# Patient Record
Sex: Male | Born: 1935 | Race: White | Hispanic: No | Smoking: Light tobacco smoker
Health system: Southern US, Community
[De-identification: ages and names within clinical notes are randomized; demographics above are authoritative.]

## PROBLEM LIST (undated history)

## (undated) DIAGNOSIS — E7800 Pure hypercholesterolemia, unspecified: Secondary | ICD-10-CM

## (undated) DIAGNOSIS — R011 Cardiac murmur, unspecified: Secondary | ICD-10-CM

## (undated) DIAGNOSIS — F419 Anxiety disorder, unspecified: Secondary | ICD-10-CM

## (undated) DIAGNOSIS — R51 Headache: Secondary | ICD-10-CM

## (undated) DIAGNOSIS — R519 Headache, unspecified: Secondary | ICD-10-CM

## (undated) DIAGNOSIS — C801 Malignant (primary) neoplasm, unspecified: Secondary | ICD-10-CM

## (undated) HISTORY — DX: Malignant (primary) neoplasm, unspecified: C80.1

## (undated) HISTORY — DX: Pure hypercholesterolemia, unspecified: E78.00

---

## 2011-02-04 ENCOUNTER — Ambulatory Visit (INDEPENDENT_AMBULATORY_CARE_PROVIDER_SITE_OTHER): Payer: Medicare Other | Admitting: Orthopedic Surgery

## 2011-02-04 ENCOUNTER — Encounter: Payer: Self-pay | Admitting: Orthopedic Surgery

## 2011-02-04 VITALS — HR 78 | Resp 18 | Wt 161.0 lb

## 2011-02-04 DIAGNOSIS — M75101 Unspecified rotator cuff tear or rupture of right shoulder, not specified as traumatic: Secondary | ICD-10-CM | POA: Insufficient documentation

## 2011-02-04 DIAGNOSIS — M719 Bursopathy, unspecified: Secondary | ICD-10-CM

## 2011-02-04 MED ORDER — METHYLPREDNISOLONE ACETATE 40 MG/ML IJ SUSP: 40.0000 mg | Freq: Once | INTRAMUSCULAR | Status: DC

## 2011-02-04 NOTE — Progress Notes (Signed)
   New patient  76 years old atraumatic onset of throbbing constant RIGHT shoulder pain associated with loss of internal rotation.  Patient has pain at night patient has had pain for 6 months  No trauma  He would like a shot in the shoulder  He reports all review of systems 14 of them negative  History reviewed. No pertinent past medical history.  History reviewed. No pertinent past surgical history.  Physical Exam(12) GENERAL: normal development   CDV: pulses are normal   Skin: normal  Lymph: nodes were not palpable/normal  Psychiatric: awake, alert and oriented  Neuro: normal sensation  MSK RIGHT shoulder exam 1No tenderness over the a.c. Joint although it is prominent no swelling 2 Range of motion with flexion and external rotation but he has decreased internal rotation 3 Rotator cuff strength normal 4 Shoulder stability normal 5 Impingement sign positive 6 Cross chest compression test and negative  Assessment: Rotator cuff syndrome    Plan: Inject RIGHT shoulder subacromial space  Shoulder Injection Procedure Note   Pre-operative Diagnosis: right  RC Syndrome  Post-operative Diagnosis: same  Indications: pain   Anesthesia: ethyl chloride   Procedure Details   Verbal consent was obtained for the procedure. The shoulder was prepped withalcohol and the skin was anesthetized. A 20 gauge needle was advanced into the subacromial space through posterior approach without difficulty  The space was then injected with 3 ml 1% lidocaine and 1 ml of depomedrol. The injection site was cleansed with isopropyl alcohol and a dressing was applied.  Complications:  None; patient tolerated the procedure well.

## 2011-02-04 NOTE — Patient Instructions (Signed)
You have received a steroid shot. 15% of patients experience increased pain at the injection site with in the next 24 hours. This is best treated with ice and tylenol extra strength 2 tabs every 8 hours. If you are still having pain please call the office.    

## 2014-10-16 ENCOUNTER — Encounter (INDEPENDENT_AMBULATORY_CARE_PROVIDER_SITE_OTHER): Payer: Self-pay | Admitting: *Deleted

## 2014-11-12 ENCOUNTER — Telehealth (INDEPENDENT_AMBULATORY_CARE_PROVIDER_SITE_OTHER): Payer: Self-pay | Admitting: *Deleted

## 2014-11-12 ENCOUNTER — Encounter (INDEPENDENT_AMBULATORY_CARE_PROVIDER_SITE_OTHER): Payer: Self-pay | Admitting: Internal Medicine

## 2014-11-12 ENCOUNTER — Other Ambulatory Visit (INDEPENDENT_AMBULATORY_CARE_PROVIDER_SITE_OTHER): Payer: Self-pay | Admitting: *Deleted

## 2014-11-12 ENCOUNTER — Ambulatory Visit (INDEPENDENT_AMBULATORY_CARE_PROVIDER_SITE_OTHER): Payer: Medicare Other | Admitting: Internal Medicine

## 2014-11-12 VITALS — BP 122/62 | HR 64 | Temp 98.0°F | Ht 67.0 in | Wt 142.0 lb

## 2014-11-12 DIAGNOSIS — K625 Hemorrhage of anus and rectum: Secondary | ICD-10-CM

## 2014-11-12 DIAGNOSIS — R198 Other specified symptoms and signs involving the digestive system and abdomen: Secondary | ICD-10-CM | POA: Diagnosis not present

## 2014-11-12 DIAGNOSIS — K6289 Other specified diseases of anus and rectum: Secondary | ICD-10-CM

## 2014-11-12 DIAGNOSIS — K6389 Other specified diseases of intestine: Secondary | ICD-10-CM

## 2014-11-12 MED ORDER — PEG 3350-KCL-NA BICARB-NACL 420 G PO SOLR: 4000.0000 mL | Freq: Once | ORAL | Status: DC

## 2014-11-12 NOTE — Patient Instructions (Signed)
Colonoscopy.  The risks and benefits such as perforation, bleeding, and infection were reviewed with the patient and is agreeable. 

## 2014-11-12 NOTE — Progress Notes (Addendum)
   Subjective:    Patient ID: Brad Sanchez, male    DOB: 1934/11/28, 80 y.o.   MRN: [id number]  HPI Referred to our office by Dr. Everette Rank for rectal bleeding. Patient states he has had rectal bleeding. He denies any rectal or abdominal pain . He has had the bleeding since February or March. He sees the blood daily if he doesn't have a good BM. He see the blood in the stool and when he wipes. There has been no change in his stools. Sometimes he has diarrhea. Sometimes he has fecal incontinence. Appetite is not good. He says he has lost about 15 pounds this year. He denies abdominal pain. No family hx of colon cancer. He has never undergone a colonoscopy in the past.    10/08/2014 H and H 12.2 and 38.0, MCV 83.3, platelet ct 213, total protein 5.6, albumin 3.3   Review of Systems Widowed. Three boys in good health.     Past Medical History  Diagnosis Date  . High cholesterol     No past surgical history on file.  No Known Allergies  Current Outpatient Prescriptions on File Prior to Visit  Medication Sig Dispense Refill  . LORazepam (ATIVAN) 1 MG tablet Take 1 mg by mouth every 8 (eight) hours.      . rosuvastatin (CRESTOR) 20 MG tablet Take 20 mg by mouth daily.       Current Facility-Administered Medications on File Prior to Visit  Medication Dose Route Frequency Provider Last Rate Last Dose  . methylPREDNISolone acetate (DEPO-MEDROL) injection 40 mg  40 mg Intra-articular Once Carole Civil, MD         Objective:   Physical Exam Blood pressure 122/62, pulse 64, temperature 98 F (36.7 C), height 5\' 7"  (1.702 m), weight 142 lb (64.411 kg). Alert and oriented. Skin warm and dry. Oral mucosa is moist.   . Sclera anicteric, conjunctivae is pink. Thyroid not enlarged. No cervical lymphadenopathy. Lungs clear. Heart regular rate and rhythm. Loud murmur heard.  Abdomen is soft. Bowel sounds are positive. No hepatomegaly. No abdominal masses felt. No tenderness. Large  rectal mass felt. Guaiac positive. Dr Laural Golden in with patient. Rectal exam by Dr. Laural Golden also revealed rectal mass.     Developer: [id number] ( 04/2018) Card: Lot [id number] (12/18)    Assessment & Plan:  Rectal bleeding. Rectal mass. Colonic carcinoma needs to be ruled out. Colonoscopy. I discussed this case with Dr. Laural Golden.

## 2014-11-12 NOTE — Telephone Encounter (Signed)
Patient needs trilyte 

## 2014-11-13 ENCOUNTER — Other Ambulatory Visit (INDEPENDENT_AMBULATORY_CARE_PROVIDER_SITE_OTHER): Payer: Self-pay | Admitting: *Deleted

## 2014-11-13 ENCOUNTER — Encounter (INDEPENDENT_AMBULATORY_CARE_PROVIDER_SITE_OTHER): Payer: Self-pay

## 2014-11-13 ENCOUNTER — Encounter (HOSPITAL_COMMUNITY): Payer: Self-pay | Admitting: *Deleted

## 2014-11-13 ENCOUNTER — Encounter (HOSPITAL_COMMUNITY)
Admission: RE | Disposition: A | Discharge status: Home / Self Care | Payer: Self-pay | Source: Ambulatory Visit | Attending: Internal Medicine

## 2014-11-13 ENCOUNTER — Telehealth (INDEPENDENT_AMBULATORY_CARE_PROVIDER_SITE_OTHER): Payer: Self-pay | Admitting: *Deleted

## 2014-11-13 ENCOUNTER — Ambulatory Visit (HOSPITAL_COMMUNITY)
Admission: RE | Admit: 2014-11-13 | Discharge: 2014-11-13 | Disposition: A | Discharge status: Home / Self Care | Payer: Medicare Other | Source: Ambulatory Visit | Attending: Internal Medicine | Admitting: Internal Medicine

## 2014-11-13 ENCOUNTER — Ambulatory Visit (HOSPITAL_COMMUNITY): Payer: Medicare Other

## 2014-11-13 DIAGNOSIS — K5730 Diverticulosis of large intestine without perforation or abscess without bleeding: Secondary | ICD-10-CM | POA: Insufficient documentation

## 2014-11-13 DIAGNOSIS — E780 Pure hypercholesterolemia: Secondary | ICD-10-CM | POA: Diagnosis not present

## 2014-11-13 DIAGNOSIS — D127 Benign neoplasm of rectosigmoid junction: Secondary | ICD-10-CM | POA: Insufficient documentation

## 2014-11-13 DIAGNOSIS — D123 Benign neoplasm of transverse colon: Secondary | ICD-10-CM | POA: Diagnosis not present

## 2014-11-13 DIAGNOSIS — Z9119 Patient's noncompliance with other medical treatment and regimen: Secondary | ICD-10-CM | POA: Diagnosis not present

## 2014-11-13 DIAGNOSIS — C20 Malignant neoplasm of rectum: Secondary | ICD-10-CM

## 2014-11-13 DIAGNOSIS — Z79899 Other long term (current) drug therapy: Secondary | ICD-10-CM | POA: Insufficient documentation

## 2014-11-13 DIAGNOSIS — K6289 Other specified diseases of anus and rectum: Secondary | ICD-10-CM

## 2014-11-13 DIAGNOSIS — K625 Hemorrhage of anus and rectum: Secondary | ICD-10-CM

## 2014-11-13 HISTORY — PX: COLONOSCOPY: SHX5424

## 2014-11-13 LAB — CBC
HCT: 33.5 % — ABNORMAL LOW (ref 39.0–52.0)
HEMOGLOBIN: 10.7 g/dL — AB (ref 13.0–17.0)
MCH: 26.6 pg (ref 26.0–34.0)
MCHC: 31.9 g/dL (ref 30.0–36.0)
MCV: 83.3 fL (ref 78.0–100.0)
Platelets: 190 10*3/uL (ref 150–400)
RBC: 4.02 MIL/uL — ABNORMAL LOW (ref 4.22–5.81)
RDW: 12.9 % (ref 11.5–15.5)
WBC: 6.8 10*3/uL (ref 4.0–10.5)

## 2014-11-13 LAB — CREATININE, SERUM
Creatinine, Ser: 0.88 mg/dL (ref 0.61–1.24)
GFR calc Af Amer: >60 mL/min (ref >60)

## 2014-11-13 SURGERY — COLONOSCOPY
Anesthesia: Moderate Sedation

## 2014-11-13 MED ORDER — MEPERIDINE HCL 50 MG/ML IJ SOLN
INTRAMUSCULAR | Status: AC
  Filled 2014-11-13: qty 1

## 2014-11-13 MED ORDER — IOHEXOL 300 MG/ML  SOLN
100.0000 mL | Freq: Once | INTRAMUSCULAR | Status: AC | PRN
Start: 2014-11-13 — End: 2014-11-13
  Administered 2014-11-13: 100 mL via INTRAVENOUS

## 2014-11-13 MED ORDER — SODIUM CHLORIDE 0.9 % IV SOLN
INTRAVENOUS | Status: DC
  Administered 2014-11-13: 1000 mL via INTRAVENOUS

## 2014-11-13 MED ORDER — MEPERIDINE HCL 50 MG/ML IJ SOLN
INTRAMUSCULAR | Status: DC | PRN
  Administered 2014-11-13 (×2): 25 mg via INTRAVENOUS

## 2014-11-13 MED ORDER — STERILE WATER FOR IRRIGATION IR SOLN
Status: DC | PRN
  Administered 2014-11-13: 09:00:00

## 2014-11-13 MED ORDER — MIDAZOLAM HCL 5 MG/5ML IJ SOLN
INTRAMUSCULAR | Status: AC
  Filled 2014-11-13: qty 10

## 2014-11-13 MED ORDER — MIDAZOLAM HCL 5 MG/5ML IJ SOLN
INTRAMUSCULAR | Status: DC | PRN
  Administered 2014-11-13 (×2): 2 mg via INTRAVENOUS
  Administered 2014-11-13: 1 mg via INTRAVENOUS
  Administered 2014-11-13 (×2): 2 mg via INTRAVENOUS
  Administered 2014-11-13: 1 mg via INTRAVENOUS

## 2014-11-13 NOTE — Progress Notes (Signed)
Radiology notified of orders for CT today.  Contrast to be brought to patient to drink.

## 2014-11-13 NOTE — Progress Notes (Signed)
Patient to Radiology via wheelchair.  Patient Ride (friend Lina Sayre notified to pick up patient around 12:45pm).

## 2014-11-13 NOTE — H&P (Signed)
Brad Sanchez is an 80 y.o. male.   Chief Complaint: Patient is here for colonoscopy. HPI: Patient is 80 year old Caucasian male was referred through the courtesy of Dr. Luster Landsberg Maginnis for colonoscopy. He has never been screened for CRC. He presented again with history of rectal bleeding with onset in February this year. He was found to have rectal mass. His H&H and 10/08/2014 was 12.2 and 38.0. He has lost 15 pounds over several months. He believes his weight loss is because he does not cover and needs foods that are microwavable. He has had intermittent diarrhea. Family history is negative for CRC.  Past Medical History  Diagnosis Date  . High cholesterol     History reviewed. No pertinent past surgical history.  History reviewed. No pertinent family history. Social History:  reports that he has never smoked. He does not have any smokeless tobacco history on file. He reports that he does not drink alcohol or use illicit drugs.  Allergies: No Known Allergies  Facility-administered medications prior to admission  Medication Dose Route Frequency Provider Last Rate Last Dose  . methylPREDNISolone acetate (DEPO-MEDROL) injection 40 mg  40 mg Intra-articular Once Carole Civil, MD       Medications Prior to Admission  Medication Sig Dispense Refill  . acetaminophen (TYLENOL) 325 MG tablet Take 650 mg by mouth every 6 (six) hours as needed.    Marland Kitchen LORazepam (ATIVAN) 1 MG tablet Take 1 mg by mouth every 8 (eight) hours.      . polyethylene glycol-electrolytes (NULYTELY/GOLYTELY) 420 G solution Take 4,000 mLs by mouth once. 4000 mL 0  . rosuvastatin (CRESTOR) 20 MG tablet Take 20 mg by mouth daily.        No results found for this or any previous visit (from the past 48 hour(s)). No results found.  ROS  Blood pressure 144/62, pulse 89, temperature 97.9 F (36.6 C), temperature source Oral, resp. rate 19, SpO2 100 %. Physical Exam  Constitutional: He appears well-developed  and well-nourished.  HENT:  Mouth/Throat: Oropharynx is clear and moist.  Eyes: Conjunctivae are normal.  Neck: No thyromegaly present.  Cardiovascular: Normal rate and regular rhythm.   Murmur (Grade 2/6 high patient murmur best heard at L LSB) heard. GI: Soft. He exhibits no distension and no mass. There is no tenderness.  Musculoskeletal: He exhibits no edema.  Lymphadenopathy:    He has no cervical adenopathy.  Neurological: He is alert.  Skin: Skin is warm and dry.     Assessment/Plan Rectal bleeding. Rectal mass. Diagnostic colonoscopy.  REHMAN,NAJEEB U 11/13/2014, 8:38 AM

## 2014-11-13 NOTE — Progress Notes (Signed)
Patient tolerating second bottle of Omni300.

## 2014-11-13 NOTE — Progress Notes (Signed)
Dr. Laural Golden in to speak with patient.

## 2014-11-13 NOTE — Discharge Instructions (Signed)
Resume usual medications and diet. No driving for 24 hours. Physician will call with results of blood tests biopsy and CT.      Colonoscopy, Care After These instructions give you information on caring for yourself after your procedure. Your doctor may also give you more specific instructions. Call your doctor if you have any problems or questions after your procedure. HOME CARE  Do not drive for 24 hours.  Do not sign important papers or use machinery for 24 hours.  You may shower.  You may go back to your usual activities, but go slower for the first 24 hours.  Take rest breaks often during the first 24 hours.  Walk around or use warm packs on your belly (abdomen) if you have belly cramping or gas.  Drink enough fluids to keep your pee (urine) clear or pale yellow.  Resume your normal diet. Avoid heavy or fried foods.  Avoid drinking alcohol for 24 hours or as told by your doctor.  Only take medicines as told by your doctor. If a tissue sample (biopsy) was taken during the procedure:   Do not take aspirin or blood thinners for 7 days, or as told by your doctor.  Do not drink alcohol for 7 days, or as told by your doctor.  Eat soft foods for the first 24 hours. GET HELP IF: You still have a small amount of blood in your poop (stool) 2-3 days after the procedure. GET HELP RIGHT AWAY IF:  You have more than a small amount of blood in your poop.  You see clumps of tissue (blood clots) in your poop.  Your belly is puffy (swollen).  You feel sick to your stomach (nauseous) or throw up (vomit).  You have a fever.  You have belly pain that gets worse and medicine does not help. MAKE SURE YOU:  Understand these instructions.  Will watch your condition.  Will get help right away if you are not doing well or get worse.   Diverticulosis Diverticulosis is the condition that develops when small pouches (diverticula) form in the wall of your colon. Your colon, or  large intestine, is where water is absorbed and stool is formed. The pouches form when the inside layer of your colon pushes through weak spots in the outer layers of your colon. CAUSES  No one knows exactly what causes diverticulosis. RISK FACTORS  Being older than 32. Your risk for this condition increases with age. Diverticulosis is rare in people younger than 40 years. By age 29, almost everyone has it.  Eating a low-fiber diet.  Being frequently constipated.  Being overweight.  Not getting enough exercise.  Smoking.  Taking over-the-counter pain medicines, like aspirin and ibuprofen. SYMPTOMS  Most people with diverticulosis do not have symptoms. DIAGNOSIS  Because diverticulosis often has no symptoms, health care providers often discover the condition during an exam for other colon problems. In many cases, a health care provider will diagnose diverticulosis while using a flexible scope to examine the colon (colonoscopy). TREATMENT  If you have never developed an infection related to diverticulosis, you may not need treatment. If you have had an infection before, treatment may include:  Eating more fruits, vegetables, and grains.  Taking a fiber supplement.  Taking a live bacteria supplement (probiotic).  Taking medicine to relax your colon. HOME CARE INSTRUCTIONS   Drink at least 6-8 glasses of water each day to prevent constipation.  Try not to strain when you have a bowel movement.  Keep  all follow-up appointments. If you have had an infection before:  Increase the fiber in your diet as directed by your health care provider or dietitian.  Take a dietary fiber supplement if your health care provider approves.  Only take medicines as directed by your health care provider. SEEK MEDICAL CARE IF:   You have abdominal pain.  You have bloating.  You have cramps.  You have not gone to the bathroom in 3 days. SEEK IMMEDIATE MEDICAL CARE IF:   Your pain gets  worse.  Yourbloating becomes very bad.  You have a fever or chills, and your symptoms suddenly get worse.  You begin vomiting.  You have bowel movements that are bloody or black. MAKE SURE YOU:  Understand these instructions.  Will watch your condition.  Will get help right away if you are not doing well or get worse.   r.

## 2014-11-13 NOTE — Op Note (Signed)
COLONOSCOPY PROCEDURE REPORT  PATIENT:  Brad Sanchez  MR#:  [id number] Birthdate:  1934/09/02, 80 y.o., male Endoscopist:  Dr. Rogene Houston, MD Referred By:  Dr. [name], MD  Procedure Date: 11/13/2014  Procedure:   Colonoscopy  Indications:  Patient is 80 year old Caucasian male who presents with rectal bleeding and on exam noted to have rectal mass. He has never been screened for CRC. He has lost 15 pounds over the last few months. Family history is negative for CRC.  Informed Consent:  The procedure and risks were reviewed with the patient and informed consent was obtained.  Medications:  Demerol 50 mg IV Versed 10 mg IV  Description of procedure:  After a digital rectal exam was performed, that colonoscope was advanced from the anus through the rectum and colon to the area of the cecum, ileocecal valve and appendiceal orifice. The cecum was deeply intubated. These structures were well-seen and photographed for the record. From the level of the cecum and ileocecal valve, the scope was slowly and cautiously withdrawn. The mucosal surfaces were carefully surveyed utilizing scope tip to flexion to facilitate fold flattening as needed. The scope was pulled down into the rectum where a thorough exam including retroflexion was performed.  Findings:  Prep suboptimal with a lot of thick liquid stool as well as pieces of formed stool requiring vigorous washing throughout the procedure. Cecal landmarks were well-seen. 12 mm and 8 mm polyp in the region of hepatic flexure. These polyps were not snared because of prep. Scattered diverticula at descending and sigmoid colon. Large distal rectal mass with luminal narrowing. Multiple biopsies taken. This mass was also easily palpable on digital exam.   Therapeutic/Diagnostic Maneuvers Performed:  See above  Complications:  None  Cecal Withdrawal Time:  9  minutes  Impression:  Examination performed to cecum. Two polyps noted  in the region of hepatic flexure and one  at rectosigmoid. These polyps were not snared because of poor prep. GERD diverticula descending and sigmoid colon. Large distal rectal mass with noncritical luminal narrowing. And anoscopic appearance consistent with adenocarcinoma arising out of adenoma. This mass is also palpable on digital exam/multiple biopsies taken   Recommendations:  Standard instructions given. Check CBC and CEA today along with serum creatinine. Chest and abdominopelvic CT and contrast. I will contact patient with biopsy results and further recommendations.  Brad Sanchez  11/13/2014 9:41 AM  CC: Dr. [name], MD & Dr. Rayne Du ref. provider found

## 2014-11-13 NOTE — Progress Notes (Signed)
Patient up to bathroom to void, approximately 15cc red blood passed from rectum.  Patient currently waiting for CT scan. Dr. Laural Golden notified.  Per Dr. Laural Golden bleeding is from biopsies.  Reassured patient per Dr. Laural Golden.  Lab results from 11-13-2014 reviewed with Dr. Laural Golden.  Dr. Laural Golden to call patient this pm after testing completed.

## 2014-11-13 NOTE — Telephone Encounter (Signed)
PA's for CT chest/abd/pelvis  CT chest [phone] and CT A/P [id number]

## 2014-11-13 NOTE — Progress Notes (Signed)
Patient tolerated and completed first bottle of contrast Omni300.

## 2014-11-13 NOTE — Progress Notes (Signed)
Patient returned from Radiology. Ready for Discharge.

## 2014-11-14 LAB — CEA: CEA: 0.9 ng/mL (ref 0.0–4.7)

## 2014-11-15 ENCOUNTER — Telehealth (INDEPENDENT_AMBULATORY_CARE_PROVIDER_SITE_OTHER): Payer: Self-pay | Admitting: Internal Medicine

## 2014-11-15 ENCOUNTER — Encounter (HOSPITAL_COMMUNITY): Payer: Self-pay | Admitting: Internal Medicine

## 2014-11-15 ENCOUNTER — Telehealth: Payer: Self-pay

## 2014-11-15 DIAGNOSIS — C20 Malignant neoplasm of rectum: Secondary | ICD-10-CM

## 2014-11-15 NOTE — Telephone Encounter (Signed)
-----   Message from Milus Banister, MD sent at 11/15/2014 11:33 AM EDT ----- Regarding: RE: rectal EUS OK, I reviewed the colonoscopy. CT, labs.  HE needs lower EUS, radial only, ++MAC, next available EUS Thursday. Dx. Staging for rectal adenocarcinoma.  Thanks  ----- Message -----    From: Barron Alvine, CMA    Sent: 11/15/2014   8:33 AM      To: Milus Banister, MD Subject: Melton Alar: rectal EUS                                   ----- Message -----    From: Worthy Keeler    Sent: 11/15/2014   7:57 AM      To: Barron Alvine, CMA Subject: rectal EUS                                     Hi Amiya Escamilla, per Dr Laural Golden Mr Lepore needs a rectal EUS for rectal CA, all records in [facility] thanks

## 2014-11-15 NOTE — Telephone Encounter (Signed)
Patient presented today to go over biopsy report from his colonoscopy. Patient adenocarcinoma of the rectum. All questions were answered to the best of my knowledge. He will see Dr. Ardis Hughs for EUS of the rectum.  Referral has been made. He will be seeing Dr. Whitney Muse in [place] at the [facility] [place]. Referral made for Dr. Whitney Muse. Would like to thank Lacretia Nicks for helping me with this.

## 2014-11-20 ENCOUNTER — Other Ambulatory Visit: Payer: Self-pay

## 2014-11-20 ENCOUNTER — Encounter (HOSPITAL_COMMUNITY): Payer: Self-pay | Admitting: *Deleted

## 2014-11-20 DIAGNOSIS — C20 Malignant neoplasm of rectum: Secondary | ICD-10-CM

## 2014-11-20 NOTE — Telephone Encounter (Signed)
Brad Sanchez scheduled for 11/28/14 1230 pm instructions have been mailed and message left for pt to return call for verbal instructions

## 2014-11-21 ENCOUNTER — Encounter (HOSPITAL_COMMUNITY): Payer: Medicare Other | Attending: Hematology & Oncology | Admitting: Hematology & Oncology

## 2014-11-21 ENCOUNTER — Encounter (HOSPITAL_COMMUNITY): Payer: Self-pay | Admitting: Hematology & Oncology

## 2014-11-21 VITALS — BP 142/68 | HR 80 | Temp 98.2°F | Resp 18 | Wt 142.6 lb

## 2014-11-21 DIAGNOSIS — R634 Abnormal weight loss: Secondary | ICD-10-CM | POA: Diagnosis not present

## 2014-11-21 DIAGNOSIS — C20 Malignant neoplasm of rectum: Secondary | ICD-10-CM

## 2014-11-21 LAB — CBC WITH DIFFERENTIAL/PLATELET
BASOS ABS: 0 10*3/uL (ref 0.0–0.1)
Basophils Relative: 0 % (ref 0–1)
Eosinophils Absolute: 0.2 10*3/uL (ref 0.0–0.7)
Eosinophils Relative: 2 % (ref 0–5)
HEMATOCRIT: 37.5 % — AB (ref 39.0–52.0)
Hemoglobin: 11.9 g/dL — ABNORMAL LOW (ref 13.0–17.0)
LYMPHS ABS: 2.3 10*3/uL (ref 0.7–4.0)
LYMPHS PCT: 25 % (ref 12–46)
MCH: 26.5 pg (ref 26.0–34.0)
MCHC: 31.7 g/dL (ref 30.0–36.0)
MCV: 83.5 fL (ref 78.0–100.0)
Monocytes Absolute: 1.2 10*3/uL — ABNORMAL HIGH (ref 0.1–1.0)
Monocytes Relative: 13 % — ABNORMAL HIGH (ref 3–12)
Neutro Abs: 5.5 10*3/uL (ref 1.7–7.7)
Neutrophils Relative %: 60 % (ref 43–77)
PLATELETS: 231 10*3/uL (ref 150–400)
RBC: 4.49 MIL/uL (ref 4.22–5.81)
RDW: 13.5 % (ref 11.5–15.5)
WBC: 9.1 10*3/uL (ref 4.0–10.5)

## 2014-11-21 LAB — COMPREHENSIVE METABOLIC PANEL
ALBUMIN: 3.2 g/dL — AB (ref 3.5–5.0)
ALT: 12 U/L — ABNORMAL LOW (ref 17–63)
AST: 18 U/L (ref 15–41)
Alkaline Phosphatase: 67 U/L (ref 38–126)
Anion gap: 7 (ref 5–15)
BILIRUBIN TOTAL: 0.5 mg/dL (ref 0.3–1.2)
BUN: 15 mg/dL (ref 6–20)
CHLORIDE: 100 mmol/L — AB (ref 101–111)
CO2: 28 mmol/L (ref 22–32)
Calcium: 8.5 mg/dL — ABNORMAL LOW (ref 8.9–10.3)
Creatinine, Ser: 0.84 mg/dL (ref 0.61–1.24)
GFR calc Af Amer: >60 mL/min (ref >60)
GFR calc non Af Amer: >60 mL/min (ref >60)
Glucose, Bld: 105 mg/dL — ABNORMAL HIGH (ref 65–99)
Potassium: 4.4 mmol/L (ref 3.5–5.1)
Sodium: 135 mmol/L (ref 135–145)
Total Protein: 6.3 g/dL — ABNORMAL LOW (ref 6.5–8.1)

## 2014-11-21 LAB — IRON AND TIBC
IRON: 19 ug/dL — AB (ref 45–182)
Saturation Ratios: 6 % — ABNORMAL LOW (ref 17.9–39.5)
TIBC: 326 ug/dL (ref 250–450)
UIBC: 307 ug/dL

## 2014-11-21 LAB — FERRITIN: Ferritin: 11 ng/mL — ABNORMAL LOW (ref 24–336)

## 2014-11-21 NOTE — Progress Notes (Signed)
[facility] NOTE  Patient Care Team: Marjean Donna, MD as PCP - General (Family Medicine)  CHIEF COMPLAINTS/PURPOSE OF CONSULTATION:  11/13/2014 Colonoscopy on 11/13/2014 with large distal rectal mass with noncritical luminal narrowing (Dr. Laural Golden) Biopsy c/w adenocarcinoma CT C/A/P on 11/13/2014 with bulky annular soft tissue mass involving the rectum measuring approximately 5.6 x 5.5 x 6.5 cm, c/w newly diagnosed rectal carcinoma. No evidence of metastatic disease  HISTORY OF PRESENTING ILLNESS:  Brad Sanchez 80 y.o. male is here because of a diagnosed adenocarcinoma of the rectum. His here today with his son Sherren Mocha and his daughter-in-law [name]. Currently lives with his son.  He has an upcoming endoscopy with Dr. Ardis Hughs in [place] on the 23rd.  Patient notes he is to do yard work and mow grass but becomes more fatigue than previously. He says he still does his yard work. He uses his riding lawnmower more. He meets with his friends every morning for breakfast and spends about 2 hours with them. He and his family note that his appetite is definitely decreased. Both think he has lost approximately 15 pounds over the last several months.  He learned of his cancer first from an overdue physical with Dr. Denton Lank.  A rectal exam was performed and he states that according to the doctor "something didn't feel right".  He was then referred to Dr. Laural Golden for a colonoscopy.  He was then referred to [facility].   He states that he noticed blood in stool before having the physical for about 4 months He says that he currently experiences no pain  He is going on vacation 7/10-7/14  He is adamant that he does not want any procedures or treatment to interfere with his planned vacation.   He has been taking OTC ferrous sulfate once daily.   MEDICAL HISTORY:  Past Medical History  Diagnosis Date  . High cholesterol   . Anxiety   . Heart murmur     Dr. Emilee Hero  told him 15 years ago  . Headache   . Cancer     rectal    SURGICAL HISTORY: Past Surgical History  Procedure Laterality Date  . Colonoscopy N/A 11/13/2014    Procedure: COLONOSCOPY;  Surgeon: Rogene Houston, MD;  Location: [facility];  Service: Endoscopy;  Laterality: N/A;  730    SOCIAL HISTORY: History   Social History  . Marital Status: Widowed    Spouse Name: N/A  . Number of Children: N/A  . Years of Education: N/A   Occupational History  . retired    Social History Main Topics  . Smoking status: Light Tobacco Smoker    Types: Cigars  . Smokeless tobacco: Not on file  . Alcohol Use: No  . Drug Use: No  . Sexual Activity: Not on file   Other Topics Concern  . Not on file   Social History Narrative  He is widowed, his wife was taken care of at [place] for 15 yrs.  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 No pertinent family history. indicated that his mother is deceased. He reported the following about his father: unknown.  Mother deceased, age 43, breast cancer   Only child  ALLERGIES:  has No Known Allergies.  MEDICATIONS:  No current outpatient prescriptions on file.  No current facility-administered medications for this visit.    Review of Systems  Constitutional: Positive for weight loss.       15 lbs over 20 years No dentures  Cardiovascular: Positive for leg swelling.       Ankle edema  Gastrointestinal: Positive for blood in stool.       Positive for 4 months  Skin: Positive for itching.       Ankle edema  Neurological: Positive for weakness.  All other systems reviewed and are negative.  14 point ROS was done and is otherwise as detailed above or in HPI   PHYSICAL EXAMINATION: ECOG PERFORMANCE STATUS: 1 - Symptomatic but completely ambulatory  Filed Vitals:    11/21/14 1357  BP: 142/68  Pulse: 80  Temp: 98.2 F (36.8 C)  Resp: 18   Filed Weights   11/21/14 1357  Weight: 142 lb 9.6 oz (64.683 kg)    Physical Exam  Constitutional: He is oriented to person, place, and time and well-developed, well-nourished, and in no distress.  Thin, somewhat confused at times  HENT:  Head: Normocephalic and atraumatic.  Nose: Nose normal.  Mouth/Throat: Oropharynx is clear and moist. No oropharyngeal exudate.  Eyes: Conjunctivae and EOM are normal. Pupils are equal, round, and reactive to light. Right eye exhibits no discharge. Left eye exhibits no discharge. No scleral icterus.  Neck: Normal range of motion. Neck supple. No tracheal deviation present. No thyromegaly present.  Cardiovascular: Normal rate and regular rhythm.  Exam reveals no gallop and no friction rub.   Murmur heard. Systolic ejection murmur  Pulmonary/Chest: Effort normal and breath sounds normal. He has no wheezes. He has no rales.  Abdominal: Soft. Bowel sounds are normal. He exhibits no distension and no mass. There is no tenderness. There is no rebound and no guarding.  Musculoskeletal: Normal range of motion. He exhibits no edema.  Lymphadenopathy:    He has no cervical adenopathy.  Neurological: He is alert and oriented to person, place, and time. He has normal reflexes. No cranial nerve deficit. Gait normal. Coordination normal.  Skin: Skin is warm and dry. No rash noted.  Psychiatric: Mood, memory, affect and judgment normal.  Nursing note and vitals reviewed.    LABORATORY DATA:  I have reviewed the data as listed Lab Results  Component Value Date   WBC 9.1 11/21/2014   HGB 11.9* 11/21/2014   HCT 37.5* 11/21/2014   MCV 83.5 11/21/2014   PLT 231 11/21/2014   Results for DOMINGOS, RIGGI (MRN [id number]) as of 11/28/2014 12:00  Ref. Range 11/13/2014 09:55  CEA Latest Ref Range: 0.0-4.7 ng/mL 0.9   RADIOGRAPHIC STUDIES: I have personally reviewed the radiological  images as listed and agreed with the findings in the report.  CLINICAL DATA: Newly diagnosed rectal carcinoma by colonoscopy. Rectal bleeding. 15 lb weight loss over past several months.  EXAM: CT CHEST, ABDOMEN, AND PELVIS WITH CONTRAST  TECHNIQUE: Multidetector CT imaging of the chest, abdomen and pelvis was performed following the standard protocol during bolus administration of intravenous contrast.  CONTRAST: 163mL OMNIPAQUE IOHEXOL 300 MG/ML SOLN  COMPARISON: None.  FINDINGS: CT CHEST FINDINGS  IMPRESSION: Bulky annular soft tissue mass involving the rectum measuring approximately 5.6 x 5.5 x 6.5 cm, consistent with newly diagnosed rectal carcinoma.  No evidence of pelvic lymphadenopathy or distant metastatic disease.  Sigmoid diverticulosis. No radiographic evidence of diverticulitis.   Electronically Signed  By: Earle Gell M.D.  On: 11/13/2014 13:07    ASSESSMENT & PLAN:  Rectal Adenocarcinoma CT C/A/P on 11/13/2014 without distant metastatic disease Lower EUS scheduled with Dr. Ardis Hughs 6/23 Mild Dementia Weight loss  I discussed with the patient and his family in detail that he has a primary rectal cancer. We briefly reviewed the NCCN guidelines. I advised them that his staging is currently not complete and that his study with Dr. Ardis Hughs next week as part of the staging process. I reviewed this CT imaging of his chest abdomen and pelvis and advised them we do not have evidence that the cancer is outside of the rectal area.  We briefly reviewed the role of chemotherapy, radiation, and surgery in the multidisciplinary treatment of rectal cancer. I suspect based upon the description of his lesion from colonoscopy he may need neo-adjuvant therapy but I also advised he and his family that the EUS we will confirm this. He does not feel he can easily get to [facility] for radiation and prefers to be referred to [facility].  During our conversation the patient  emphasized he did NOT wish to start any treatment prior to his vacation which is planned on July 10 through July 14. I advised him we could work around this.  He will need Port-A-Cath placement and I discussed this with him. He will need formal chemotherapy teaching as well. He does agree to see radiation oncology prior to his vacation and we will make the appropriate referral.  I have advised the patient and family to write down any questions they have bring them to follow-up. We will check baseline laboratory studies today including CBC, CMP, serum iron studies, ferritin and baseline CEA if not previously obtained.  No problem-specific assessment & plan notes found for this encounter.  Orders Placed This Encounter  Procedures  . CBC with Differential  . Comprehensive metabolic panel  . Ferritin  . Iron and TIBC    All questions were answered. The patient knows to call the clinic with any problems, questions or concerns. //  This note was electronically signed.    This document serves as a record of services personally performed by Ancil Linsey, MD. It was created on her behalf by Janace Hoard, a trained medical scribe. The creation of this record is based on the scribe's personal observations and the provider's statements to them. This document has been checked and approved by the attending provider.  I have reviewed the above documentation for accuracy and completeness, and I agree with the above.  Kelby Fam. Whitney Muse, MD

## 2014-11-21 NOTE — Telephone Encounter (Signed)
Left message on machine to call back  

## 2014-11-21 NOTE — Patient Instructions (Addendum)
..[place] at [facility] Discharge Instructions  RECOMMENDATIONS MADE BY THE CONSULTANT AND ANY TEST RESULTS WILL BE SENT TO YOUR REFERRING PHYSICIAN.  Exam per Dr. Whitney Muse   Labs today  Referral to [facility]  Will start treatment after your vacation  Chemo teaching with [facility] [place] on Tuesday June 28 @ 2:00. You will also see Dr. Whitney Muse @ this time.   Name of chemo:  5FU: bone marrow suppression (low white blood cells - wbcs fight infection, low red blood cells - rbcs make up your blood, low platelets - this is what makes your blood clot, nausea/vomiting, diarrhea, mouth sores, hair loss, dry skin, ocular toxicities (increased tear production, sensitivity to light). You must wear sunscreen/sunglasses. Cover your skin when out in sunlight. You will get burned very easily. This will run continuously on a pump for 42 days. We will have you come in once a week to have the needle removed from your port and then (new needle) reinserted.      Thank you for choosing [place] at [facility] to provide your oncology and hematology care.  To afford each patient quality time with our provider, please arrive at least 15 minutes before your scheduled appointment time.    You need to re-schedule your appointment should you arrive 10 or more minutes late.  We strive to give you quality time with our providers, and arriving late affects you and other patients whose appointments are after yours.  Also, if you no show three or more times for appointments you may be dismissed from the clinic at the providers discretion.     Again, thank you for choosing [facility].  Our hope is that these requests will decrease the amount of time that you wait before being seen by our physicians.       _____________________________________________________________  Should you have questions after your visit to [facility], please contact our office at [phone] between the hours of 8:30 a.m. and 4:30 p.m.  Voicemails left after 4:30 p.m. will not be returned until the following business day.  For prescription refill requests, have your pharmacy contact our office.   Fluorouracil, 5-FU injection What is this medicine? FLUOROURACIL, 5-FU (flure oh YOOR a sil) is a chemotherapy drug. It slows the growth of cancer cells. This medicine is used to treat many types of cancer like breast cancer, colon or rectal cancer, pancreatic cancer, and stomach cancer. This medicine may be used for other purposes; ask your health care provider or pharmacist if you have questions. COMMON BRAND NAME(S): Adrucil What should I tell my health care provider before I take this medicine? They need to know if you have any of these conditions: -blood disorders -dihydropyrimidine dehydrogenase (DPD) deficiency -infection (especially a virus infection such as chickenpox, cold sores, or herpes) -kidney disease -liver disease -malnourished, poor nutrition -recent or ongoing radiation therapy -an unusual or allergic reaction to fluorouracil, other chemotherapy, other medicines, foods, dyes, or preservatives -pregnant or trying to get pregnant -breast-feeding How should I use this medicine? This drug is given as an infusion or injection into a vein. It is administered in a hospital or clinic by a specially trained health care professional. Talk to your pediatrician regarding the use of this medicine in children. Special care may be needed. Overdosage: If you think you have taken too much of this medicine contact a poison control  center or emergency room at once. NOTE: This medicine is only for you. Do not share this medicine with others. What if I miss a dose? It is important not to miss your dose. Call your doctor or health care professional if you are unable to keep an appointment. What may interact with this  medicine? -allopurinol -cimetidine -dapsone -digoxin -hydroxyurea -leucovorin -levamisole -medicines for seizures like ethotoin, fosphenytoin, phenytoin -medicines to increase blood counts like filgrastim, pegfilgrastim, sargramostim -medicines that treat or prevent blood clots like warfarin, enoxaparin, and dalteparin -methotrexate -metronidazole -pyrimethamine -some other chemotherapy drugs like busulfan, cisplatin, estramustine, vinblastine -trimethoprim -trimetrexate -vaccines Talk to your doctor or health care professional before taking any of these medicines: -acetaminophen -aspirin -ibuprofen -ketoprofen -naproxen This list may not describe all possible interactions. Give your health care provider a list of all the medicines, herbs, non-prescription drugs, or dietary supplements you use. Also tell them if you smoke, drink alcohol, or use illegal drugs. Some items may interact with your medicine. What should I watch for while using this medicine? Visit your doctor for checks on your progress. This drug may make you feel generally unwell. This is not uncommon, as chemotherapy can affect healthy cells as well as cancer cells. Report any side effects. Continue your course of treatment even though you feel ill unless your doctor tells you to stop. In some cases, you may be given additional medicines to help with side effects. Follow all directions for their use. Call your doctor or health care professional for advice if you get a fever, chills or sore throat, or other symptoms of a cold or flu. Do not treat yourself. This drug decreases your body's ability to fight infections. Try to avoid being around people who are sick. This medicine may increase your risk to bruise or bleed. Call your doctor or health care professional if you notice any unusual bleeding. Be careful brushing and flossing your teeth or using a toothpick because you may get an infection or bleed more easily. If you  have any dental work done, tell your dentist you are receiving this medicine. Avoid taking products that contain aspirin, acetaminophen, ibuprofen, naproxen, or ketoprofen unless instructed by your doctor. These medicines may hide a fever. Do not become pregnant while taking this medicine. Women should inform their doctor if they wish to become pregnant or think they might be pregnant. There is a potential for serious side effects to an unborn child. Talk to your health care professional or pharmacist for more information. Do not breast-feed an infant while taking this medicine. Men should inform their doctor if they wish to father a child. This medicine may lower sperm counts. Do not treat diarrhea with over the counter products. Contact your doctor if you have diarrhea that lasts more than 2 days or if it is severe and watery. This medicine can make you more sensitive to the sun. Keep out of the sun. If you cannot avoid being in the sun, wear protective clothing and use sunscreen. Do not use sun lamps or tanning beds/booths. What side effects may I notice from receiving this medicine? Side effects that you should report to your doctor or health care professional as soon as possible: -allergic reactions like skin rash, itching or hives, swelling of the face, lips, or tongue -low blood counts - this medicine may decrease the number of white blood cells, red blood cells and platelets. You may be at increased risk for infections and bleeding. -signs of infection - fever or  chills, cough, sore throat, pain or difficulty passing urine -signs of decreased platelets or bleeding - bruising, pinpoint red spots on the skin, black, tarry stools, blood in the urine -signs of decreased red blood cells - unusually weak or tired, fainting spells, lightheadedness -breathing problems -changes in vision -chest pain -mouth sores -nausea and vomiting -pain, swelling, redness at site where injected -pain, tingling,  numbness in the hands or feet -redness, swelling, or sores on hands or feet -stomach pain -unusual bleeding Side effects that usually do not require medical attention (report to your doctor or health care professional if they continue or are bothersome): -changes in finger or toe nails -diarrhea -dry or itchy skin -hair loss -headache -loss of appetite -sensitivity of eyes to the light -stomach upset -unusually teary eyes This list may not describe all possible side effects. Call your doctor for medical advice about side effects. You may report side effects to FDA at 1-800-FDA-1088. Where should I keep my medicine? This drug is given in a hospital or clinic and will not be stored at home. NOTE: This sheet is a summary. It may not cover all possible information. If you have questions about this medicine, talk to your doctor, pharmacist, or health care provider.  2015, Elsevier/Gold Standard. (2007-09-27 13:53:16)

## 2014-11-22 ENCOUNTER — Other Ambulatory Visit (HOSPITAL_COMMUNITY): Payer: Self-pay | Admitting: Hematology & Oncology

## 2014-11-25 NOTE — Telephone Encounter (Signed)
Left message on machine to call back  

## 2014-11-26 ENCOUNTER — Ambulatory Visit (HOSPITAL_COMMUNITY): Payer: Medicare Other

## 2014-11-26 NOTE — Telephone Encounter (Signed)
Left a message for patient to return my call. 

## 2014-11-27 NOTE — Telephone Encounter (Signed)
Several messages left for the pt to return call regarding EUS instructions, they were mailed to the home.  Will wait for a return call from the pt.

## 2014-11-28 ENCOUNTER — Encounter (HOSPITAL_COMMUNITY)
Admission: RE | Disposition: A | Discharge status: Home / Self Care | Payer: Self-pay | Source: Ambulatory Visit | Attending: Gastroenterology

## 2014-11-28 ENCOUNTER — Ambulatory Visit (HOSPITAL_COMMUNITY)
Admission: RE | Admit: 2014-11-28 | Discharge: 2014-11-28 | Disposition: A | Discharge status: Home / Self Care | Payer: Medicare Other | Source: Ambulatory Visit | Attending: Gastroenterology | Admitting: Gastroenterology

## 2014-11-28 ENCOUNTER — Other Ambulatory Visit: Payer: Self-pay

## 2014-11-28 ENCOUNTER — Telehealth: Payer: Self-pay | Admitting: Gastroenterology

## 2014-11-28 ENCOUNTER — Encounter (HOSPITAL_COMMUNITY): Payer: Self-pay | Admitting: Hematology & Oncology

## 2014-11-28 DIAGNOSIS — K6289 Other specified diseases of anus and rectum: Secondary | ICD-10-CM

## 2014-11-28 HISTORY — DX: Cardiac murmur, unspecified: R01.1

## 2014-11-28 HISTORY — DX: Headache, unspecified: R51.9

## 2014-11-28 HISTORY — DX: Anxiety disorder, unspecified: F41.9

## 2014-11-28 HISTORY — DX: Headache: R51

## 2014-11-28 SURGERY — CANCELLED PROCEDURE

## 2014-11-28 NOTE — Telephone Encounter (Signed)
Procedure has been rescheduled for lower EUS on 12/12/14 Left message on machine to call back at daughter n law number

## 2014-11-28 NOTE — Telephone Encounter (Signed)
Brad Sanchez, He ate a sandwich this morning, a banana 1-2 hours ago and also did not prep for his lower EUS.  Can you call  to reschedule the appt to 7/7 since I am out of town next week (moderate sedation only), rectal cancer staging.  His daughter in law should be contacted to schedule.  Vickie [phone]  Thanks   I will let cc Dr. Whitney Muse.

## 2014-11-28 NOTE — Telephone Encounter (Signed)
EUS scheduled, pt instructed and medications reviewed.  Patient instructions mailed to home.  Patient to call with any questions or concerns.  

## 2014-11-29 NOTE — Patient Instructions (Addendum)
[place]   CHEMOTHERAPY INSTRUCTIONS  5FU: bone marrow suppression (low white blood cells - wbcs fight infection, low red blood cells - rbcs make up your blood, low platelets - this is what makes your blood clot, nausea/vomiting, diarrhea, mouth sores, hair loss, dry skin, ocular toxicities (increased tear production, sensitivity to light). You must wear sunscreen/sunglasses. Cover your skin when out in sunlight. You will get burned very easily. You will wear this as a continuous infusion via an ambulatory pump for 42 days. You will more than likely have to change the batteries in the pump during the week. You will come to [facility] once a week to have the needle removed from the port and a new needle inserted into the port. We will change out the chemo medication that same day. (exchange older medication bag for the newer medication bag).    POTENTIAL SIDE EFFECTS OF TREATMENT: Increased Susceptibility to Infection, Vomiting, Constipation, Hair Thinning, Changes in Character of Skin and Nails (brittleness, dryness,etc.), Bone Marrow Suppression, Nausea, Diarrhea, Sun Sensitivity and Mouth Sores   EDUCATIONAL MATERIALS GIVEN AND REVIEWED: Chemotherapy and You Book Specific Instruction Sheets: 105fu, Zofran, EMLA cream   SELF CARE ACTIVITIES WHILE ON CHEMOTHERAPY: Increase your fluid intake 48 hours prior to treatment and drink at least 2 quarts per day after treatment., No alcohol intake., No aspirin or other medications unless approved by your oncologist., Eat foods that are light and easy to digest., Eat foods at cold or room temperature., No fried, fatty, or spicy foods immediately before or after treatment., Have teeth cleaned professionally before starting treatment. Keep dentures and partial plates clean., Use soft toothbrush and do not use mouthwashes that contain alcohol. Biotene is a good mouthwash that is available at most pharmacies or  may be ordered by calling [phone]., Use warm salt water gargles (1 teaspoon salt per 1 quart warm water) before and after meals and at bedtime. Or you may rinse with 2 tablespoons of three -percent hydrogen peroxide mixed in eight ounces of water., Always use sunscreen with SPF (Sun Protection Factor) of 30 or higher., Use your nausea medication as directed to prevent nausea., Use your stool softener or laxative as directed to prevent constipation. and Use your anti-diarrheal medication as directed to stop diarrhea.  Please wash your hands for at least 30 seconds using warm soapy water. Handwashing is the #1 way to prevent the spread of germs. Stay away from sick people or people who are getting over a cold. If you develop respiratory systems such as green/yellow mucus production or productive cough or persistent cough let us know and we will see if you need an antibiotic. It is a good idea to keep a pair of gloves on when going into grocery stores/[employer] to decrease your risk of coming into contact with germs on the carts, etc. Carry alcohol hand gel with you at all times and use it frequently if out in public. All foods need to be cooked thoroughly. No raw foods. No medium or undercooked meats, eggs. If your food is cooked medium well, it does not need to be hot pink or saturated with bloody liquid at all. Vegetables and fruits need to be washed/rinsed under the faucet with a dish detergent before being consumed. You can eat raw fruits and vegetables unless we tell you otherwise but it would be best if you cooked them or bought frozen. Do not eat off of salad bars or hot  bars unless you really trust the cleanliness of the restaurant. If you need dental work, please let Dr. Whitney Muse know before you go for your appointment so that we can coordinate the best possible time for you in regards to your chemo regimen. You need to also let your dentist know that you are actively taking chemo. We may need to do  labs prior to your dental appointment. We also want your bowels moving at least every other day. If this is not happening, we need to know so that we can get you on a bowel regimen to help you go.       MEDICATIONS: You have been given prescriptions for the following medications:  Zofran 8mg  tablet. Take 1 tablet every 8 hours as needed for nausea/vomiting. (#1 nausea med to take, this can constipate)  EMLA cream. Apply a quarter size amount to port site 1 hour prior to chemo. Do not rub in. Cover with plastic wrap.   Over-the-Counter Meds:  Miralax 1 capful in 8 oz of fluid daily. May increase to two times a day if needed. This is a stool softener. If this doesn't work proceed you can add:  Senokot S  - start with 1 tablet two times a day and increase to 4 tablets two times a day if needed. (total of 8 tablets in a 24 hour period). This is a stimulant laxative.   Call us if this does not help your bowels move.   Imodium 2mg  capsule. Take 2 capsules after the 1st loose stool and then 1 capsule every 2 hours until you go a total of 12 hours without having a loose stool. Call the [facility] if loose stools continue.     SYMPTOMS TO REPORT AS SOON AS POSSIBLE AFTER TREATMENT:  FEVER GREATER THAN 100.5 F  CHILLS WITH OR WITHOUT FEVER  NAUSEA AND VOMITING THAT IS NOT CONTROLLED WITH YOUR NAUSEA MEDICATION  UNUSUAL SHORTNESS OF BREATH  UNUSUAL BRUISING OR BLEEDING  TENDERNESS IN MOUTH AND THROAT WITH OR WITHOUT PRESENCE OF ULCERS  URINARY PROBLEMS  BOWEL PROBLEMS  UNUSUAL RASH    Wear comfortable clothing and clothing appropriate for easy access to any Portacath or PICC line. Let us know if there is anything that we can do to make your therapy better!      I have been informed and understand all of the instructions given to me and have received a copy. I have been instructed to call the clinic [phone] or my family physician as soon as possible for continued  medical care, if indicated. I do not have any more questions at this time but understand that I may call the [facility] or the Patient Navigator at [phone] during office hours should I have questions or need assistance in obtaining follow-up care.           Fluorouracil, 5-FU injection What is this medicine? FLUOROURACIL, 5-FU (flure oh YOOR a sil) is a chemotherapy drug. It slows the growth of cancer cells. This medicine is used to treat many types of cancer like breast cancer, colon or rectal cancer, pancreatic cancer, and stomach cancer. This medicine may be used for other purposes; ask your health care provider or pharmacist if you have questions. COMMON BRAND NAME(S): Adrucil What should I tell my health care provider before I take this medicine? They need to know if you have any of these conditions: -blood disorders -dihydropyrimidine dehydrogenase (DPD) deficiency -infection (especially a virus infection such as chickenpox,  cold sores, or herpes) -kidney disease -liver disease -malnourished, poor nutrition -recent or ongoing radiation therapy -an unusual or allergic reaction to fluorouracil, other chemotherapy, other medicines, foods, dyes, or preservatives -pregnant or trying to get pregnant -breast-feeding How should I use this medicine? This drug is given as an infusion or injection into a vein. It is administered in a hospital or clinic by a specially trained health care professional. Talk to your pediatrician regarding the use of this medicine in children. Special care may be needed. Overdosage: If you think you have taken too much of this medicine contact a poison control center or emergency room at once. NOTE: This medicine is only for you. Do not share this medicine with others. What if I miss a dose? It is important not to miss your dose. Call your doctor or health care professional if you are unable to keep an appointment. What may interact with this  medicine? -allopurinol -cimetidine -dapsone -digoxin -hydroxyurea -leucovorin -levamisole -medicines for seizures like ethotoin, fosphenytoin, phenytoin -medicines to increase blood counts like filgrastim, pegfilgrastim, sargramostim -medicines that treat or prevent blood clots like warfarin, enoxaparin, and dalteparin -methotrexate -metronidazole -pyrimethamine -some other chemotherapy drugs like busulfan, cisplatin, estramustine, vinblastine -trimethoprim -trimetrexate -vaccines Talk to your doctor or health care professional before taking any of these medicines: -acetaminophen -aspirin -ibuprofen -ketoprofen -naproxen This list may not describe all possible interactions. Give your health care provider a list of all the medicines, herbs, non-prescription drugs, or dietary supplements you use. Also tell them if you smoke, drink alcohol, or use illegal drugs. Some items may interact with your medicine. What should I watch for while using this medicine? Visit your doctor for checks on your progress. This drug may make you feel generally unwell. This is not uncommon, as chemotherapy can affect healthy cells as well as cancer cells. Report any side effects. Continue your course of treatment even though you feel ill unless your doctor tells you to stop. In some cases, you may be given additional medicines to help with side effects. Follow all directions for their use. Call your doctor or health care professional for advice if you get a fever, chills or sore throat, or other symptoms of a cold or flu. Do not treat yourself. This drug decreases your body's ability to fight infections. Try to avoid being around people who are sick. This medicine may increase your risk to bruise or bleed. Call your doctor or health care professional if you notice any unusual bleeding. Be careful brushing and flossing your teeth or using a toothpick because you may get an infection or bleed more easily. If you  have any dental work done, tell your dentist you are receiving this medicine. Avoid taking products that contain aspirin, acetaminophen, ibuprofen, naproxen, or ketoprofen unless instructed by your doctor. These medicines may hide a fever. Do not become pregnant while taking this medicine. Women should inform their doctor if they wish to become pregnant or think they might be pregnant. There is a potential for serious side effects to an unborn child. Talk to your health care professional or pharmacist for more information. Do not breast-feed an infant while taking this medicine. Men should inform their doctor if they wish to father a child. This medicine may lower sperm counts. Do not treat diarrhea with over the counter products. Contact your doctor if you have diarrhea that lasts more than 2 days or if it is severe and watery. This medicine can make you more sensitive to the sun.  Keep out of the sun. If you cannot avoid being in the sun, wear protective clothing and use sunscreen. Do not use sun lamps or tanning beds/booths. What side effects may I notice from receiving this medicine? Side effects that you should report to your doctor or health care professional as soon as possible: -allergic reactions like skin rash, itching or hives, swelling of the face, lips, or tongue -low blood counts - this medicine may decrease the number of white blood cells, red blood cells and platelets. You may be at increased risk for infections and bleeding. -signs of infection - fever or chills, cough, sore throat, pain or difficulty passing urine -signs of decreased platelets or bleeding - bruising, pinpoint red spots on the skin, black, tarry stools, blood in the urine -signs of decreased red blood cells - unusually weak or tired, fainting spells, lightheadedness -breathing problems -changes in vision -chest pain -mouth sores -nausea and vomiting -pain, swelling, redness at site where injected -pain, tingling,  numbness in the hands or feet -redness, swelling, or sores on hands or feet -stomach pain -unusual bleeding Side effects that usually do not require medical attention (report to your doctor or health care professional if they continue or are bothersome): -changes in finger or toe nails -diarrhea -dry or itchy skin -hair loss -headache -loss of appetite -sensitivity of eyes to the light -stomach upset -unusually teary eyes This list may not describe all possible side effects. Call your doctor for medical advice about side effects. You may report side effects to FDA at 1-800-FDA-1088. Where should I keep my medicine? This drug is given in a hospital or clinic and will not be stored at home. NOTE: This sheet is a summary. It may not cover all possible information. If you have questions about this medicine, talk to your doctor, pharmacist, or health care provider.  2015, Elsevier/Gold Standard. (2007-09-27 13:53:16) Ondansetron tablets What is this medicine? ONDANSETRON (on DAN se tron) is used to treat nausea and vomiting caused by chemotherapy. It is also used to prevent or treat nausea and vomiting after surgery. This medicine may be used for other purposes; ask your health care provider or pharmacist if you have questions. COMMON BRAND NAME(S): Zofran What should I tell my health care provider before I take this medicine? They need to know if you have any of these conditions: -heart disease -history of irregular heartbeat -liver disease -low levels of magnesium or potassium in the blood -an unusual or allergic reaction to ondansetron, granisetron, other medicines, foods, dyes, or preservatives -pregnant or trying to get pregnant -breast-feeding How should I use this medicine? Take this medicine by mouth with a glass of water. Follow the directions on your prescription label. Take your doses at regular intervals. Do not take your medicine more often than directed. Talk to your  pediatrician regarding the use of this medicine in children. Special care may be needed. Overdosage: If you think you have taken too much of this medicine contact a poison control center or emergency room at once. NOTE: This medicine is only for you. Do not share this medicine with others. What if I miss a dose? If you miss a dose, take it as soon as you can. If it is almost time for your next dose, take only that dose. Do not take double or extra doses. What may interact with this medicine? Do not take this medicine with any of the following medications: -apomorphine -certain medicines for fungal infections like fluconazole, itraconazole, ketoconazole, posaconazole,  voriconazole -cisapride -dofetilide -dronedarone -pimozide -thioridazine -ziprasidone This medicine may also interact with the following medications: -carbamazepine -certain medicines for depression, anxiety, or psychotic disturbances -fentanyl -linezolid -MAOIs like Carbex, Eldepryl, Marplan, Nardil, and Parnate -methylene blue (injected into a vein) -other medicines that prolong the QT interval (cause an abnormal heart rhythm) -phenytoin -rifampicin -tramadol This list may not describe all possible interactions. Give your health care provider a list of all the medicines, herbs, non-prescription drugs, or dietary supplements you use. Also tell them if you smoke, drink alcohol, or use illegal drugs. Some items may interact with your medicine. What should I watch for while using this medicine? Check with your doctor or health care professional right away if you have any sign of an allergic reaction. What side effects may I notice from receiving this medicine? Side effects that you should report to your doctor or health care professional as soon as possible: -allergic reactions like skin rash, itching or hives, swelling of the face, lips or tongue -breathing problems -confusion -dizziness -fast or irregular  heartbeat -feeling faint or lightheaded, falls -fever and chills -loss of balance or coordination -seizures -sweating -swelling of the hands or feet -tightness in the chest -tremors -unusually weak or tired Side effects that usually do not require medical attention (report to your doctor or health care professional if they continue or are bothersome): -constipation or diarrhea -headache This list may not describe all possible side effects. Call your doctor for medical advice about side effects. You may report side effects to FDA at 1-800-FDA-1088. Where should I keep my medicine? Keep out of the reach of children. Store between 2 and 30 degrees C (36 and 86 degrees F). Throw away any unused medicine after the expiration date. NOTE: This sheet is a summary. It may not cover all possible information. If you have questions about this medicine, talk to your doctor, pharmacist, or health care provider.  2015, Elsevier/Gold Standard. (2013-02-28 16:27:45) Lidocaine; Prilocaine cream What is this medicine? LIDOCAINE; PRILOCAINE (LYE doe kane; PRIL oh kane) is a topical anesthetic that causes loss of feeling in the skin and surrounding tissues. It is used to numb the skin before procedures or injections. This medicine may be used for other purposes; ask your health care provider or pharmacist if you have questions. COMMON BRAND NAME(S): EMLA What should I tell my health care provider before I take this medicine? They need to know if you have any of these conditions: -glucose-6-phosphate deficiencies -heart disease -kidney or liver disease -methemoglobinemia -an unusual or allergic reaction to lidocaine, prilocaine, other medicines, foods, dyes, or preservatives -pregnant or trying to get pregnant -breast-feeding How should I use this medicine? This medicine is for external use only on the skin. Do not take by mouth. Follow the directions on the prescription label. Wash hands before and  after use. Do not use more or leave in contact with the skin longer than directed. Do not apply to eyes or open wounds. It can cause irritation and blurred or temporary loss of vision. If this medicine comes in contact with your eyes, immediately rinse the eye with water. Do not touch or rub the eye. Contact your health care provider right away. Talk to your pediatrician regarding the use of this medicine in children. While this medicine may be prescribed for children for selected conditions, precautions do apply. Overdosage: If you think you have taken too much of this medicine contact a poison control center or emergency room at once. NOTE: This medicine  is only for you. Do not share this medicine with others. What if I miss a dose? This medicine is usually only applied once prior to each procedure. It must be in contact with the skin for a period of time for it to work. If you applied this medicine later than directed, tell your health care professional before starting the procedure. What may interact with this medicine? -acetaminophen -chloroquine -dapsone -medicines to control heart rhythm -nitrates like nitroglycerin and nitroprusside -other ointments, creams, or sprays that may contain anesthetic medicine -phenobarbital -phenytoin -quinine -sulfonamides like sulfacetamide, sulfamethoxazole, sulfasalazine and others This list may not describe all possible interactions. Give your health care provider a list of all the medicines, herbs, non-prescription drugs, or dietary supplements you use. Also tell them if you smoke, drink alcohol, or use illegal drugs. Some items may interact with your medicine. What should I watch for while using this medicine? Be careful to avoid injury to the treated area while it is numb and you are not aware of pain. Avoid scratching, rubbing, or exposing the treated area to hot or cold temperatures until complete sensation has returned. The numb feeling will wear off  a few hours after applying the cream. What side effects may I notice from receiving this medicine? Side effects that you should report to your doctor or health care professional as soon as possible: -blurred vision -chest pain -difficulty breathing -dizziness -drowsiness -fast or irregular heartbeat -skin rash or itching -swelling of your throat, lips, or face -trembling Side effects that usually do not require medical attention (report to your doctor or health care professional if they continue or are bothersome): -changes in ability to feel hot or cold -redness and swelling at the application site This list may not describe all possible side effects. Call your doctor for medical advice about side effects. You may report side effects to FDA at 1-800-FDA-1088. Where should I keep my medicine? Keep out of reach of children. Store at room temperature between 15 and 30 degrees C (59 and 86 degrees F). Keep container tightly closed. Throw away any unused medicine after the expiration date. NOTE: This sheet is a summary. It may not cover all possible information. If you have questions about this medicine, talk to your doctor, pharmacist, or health care provider.  2015, Elsevier/Gold Standard. (2007-11-27 17:14:35)

## 2014-12-02 ENCOUNTER — Inpatient Hospital Stay (HOSPITAL_COMMUNITY): Payer: Medicare Other

## 2014-12-02 DIAGNOSIS — C20 Malignant neoplasm of rectum: Secondary | ICD-10-CM | POA: Insufficient documentation

## 2014-12-02 MED ORDER — ONDANSETRON HCL 8 MG PO TABS
8.0000 mg | ORAL_TABLET | Freq: Three times a day (TID) | ORAL | Status: AC | PRN
Start: 2014-12-02 — End: ?

## 2014-12-02 MED ORDER — LIDOCAINE-PRILOCAINE 2.5-2.5 % EX CREA: TOPICAL_CREAM | CUTANEOUS | Status: DC

## 2014-12-03 ENCOUNTER — Ambulatory Visit (HOSPITAL_COMMUNITY): Payer: Medicare Other | Admitting: Hematology & Oncology

## 2014-12-03 ENCOUNTER — Encounter (HOSPITAL_BASED_OUTPATIENT_CLINIC_OR_DEPARTMENT_OTHER): Payer: Medicare Other

## 2014-12-03 ENCOUNTER — Encounter (HOSPITAL_COMMUNITY): Payer: Self-pay

## 2014-12-03 DIAGNOSIS — C20 Malignant neoplasm of rectum: Secondary | ICD-10-CM | POA: Diagnosis not present

## 2014-12-03 DIAGNOSIS — R799 Abnormal finding of blood chemistry, unspecified: Secondary | ICD-10-CM

## 2014-12-03 DIAGNOSIS — K625 Hemorrhage of anus and rectum: Secondary | ICD-10-CM | POA: Diagnosis not present

## 2014-12-03 MED ORDER — SODIUM CHLORIDE 0.9 % IV SOLN
125.0000 mg | Freq: Every day | INTRAVENOUS | Status: DC
  Administered 2014-12-03: 125 mg via INTRAVENOUS
  Filled 2014-12-03 (×2): qty 10

## 2014-12-03 MED ORDER — SODIUM CHLORIDE 0.9 % IV SOLN
INTRAVENOUS | Status: DC
  Administered 2014-12-03: 12:00:00 via INTRAVENOUS

## 2014-12-03 NOTE — Progress Notes (Signed)
Chemo teaching done and consent signed for 5FU. Patient to be scheduled @ IR for port placement. Pt will not have port placed until [place] beach [place]. Pt to not start any treatment until returning from beach also.

## 2014-12-03 NOTE — Patient Instructions (Signed)
[facility] at [facility] Discharge Instructions  RECOMMENDATIONS MADE BY THE CONSULTANT AND ANY TEST RESULTS WILL BE SENT TO YOUR REFERRING PHYSICIAN.  Iron infusion today Follow up as scheduled Please call the clinic if you have any questions or concerns  Thank you for choosing [facility] at [facility] to provide your oncology and hematology care.  To afford each patient quality time with our provider, please arrive at least 15 minutes before your scheduled appointment time.    You need to re-schedule your appointment should you arrive 10 or more minutes late.  We strive to give you quality time with our providers, and arriving late affects you and other patients whose appointments are after yours.  Also, if you no show three or more times for appointments you may be dismissed from the clinic at the providers discretion.     Again, thank you for choosing [facility].  Our hope is that these requests will decrease the amount of time that you wait before being seen by our physicians.       _____________________________________________________________  Should you have questions after your visit to [facility], please contact our office at [phone] between the hours of 8:30 a.m. and 4:30 p.m.  Voicemails left after 4:30 p.m. will not be returned until the following business day.  For prescription refill requests, have your pharmacy contact our office.    

## 2014-12-03 NOTE — Progress Notes (Signed)
Brad Sanchez Tolerated iron infusion well today discharged ambulatory

## 2014-12-04 ENCOUNTER — Ambulatory Visit (HOSPITAL_COMMUNITY): Payer: Medicare Other | Admitting: Hematology & Oncology

## 2014-12-04 NOTE — Addendum Note (Signed)
Addended by: Gerhard Perches on: 12/04/2014 09:57 AM   Modules accepted: Orders

## 2014-12-05 ENCOUNTER — Encounter (HOSPITAL_COMMUNITY): Payer: Self-pay | Admitting: Hematology & Oncology

## 2014-12-05 ENCOUNTER — Encounter (HOSPITAL_BASED_OUTPATIENT_CLINIC_OR_DEPARTMENT_OTHER): Payer: Medicare Other | Admitting: Hematology & Oncology

## 2014-12-05 VITALS — BP 117/68 | HR 88 | Temp 97.4°F | Resp 22 | Wt 141.2 lb

## 2014-12-05 DIAGNOSIS — C20 Malignant neoplasm of rectum: Secondary | ICD-10-CM | POA: Diagnosis not present

## 2014-12-05 DIAGNOSIS — R634 Abnormal weight loss: Secondary | ICD-10-CM | POA: Diagnosis not present

## 2014-12-05 DIAGNOSIS — F0390 Unspecified dementia without behavioral disturbance: Secondary | ICD-10-CM

## 2014-12-05 NOTE — Progress Notes (Signed)
Brad Sanchez  Patient Care Team: Brad Donna, MD as PCP - General (Family Medicine)  CHIEF COMPLAINTS/PURPOSE OF CONSULTATION:  11/13/2014 Colonoscopy on 11/13/2014 with large distal rectal mass with noncritical luminal narrowing (Dr. Laural Sanchez) Biopsy c/w adenocarcinoma CT C/A/P on 11/13/2014 with bulky annular soft tissue mass involving the rectum measuring approximately 5.6 x 5.5 x 6.5 cm, c/w newly diagnosed rectal carcinoma. No evidence of metastatic disease  HISTORY OF PRESENTING ILLNESS:  Brad Sanchez 80 y.o. male is here because of a diagnosed adenocarcinoma of the rectum. His here today with his daughter-in-law Brad Sanchez. Currently lives with his son.  Discussed having his port put in on July 18th, then starting chemotherapy the next day. He has been to radiation in [place]. He is scheduled for an endoscopic ultrasound on July 8th.  He is still planning for his vacation to the beach 7/10-7/14. He is very adamant that none of his treatment of appointments interfere with his vacation plans.  He denies rectal bleeding or pain.   MEDICAL HISTORY:  Past Medical History  Diagnosis Date  . High cholesterol   . Anxiety   . Heart murmur     Dr. Emilee Sanchez told him 15 years ago  . Headache   . Cancer     rectal    SURGICAL HISTORY: Past Surgical History  Procedure Laterality Date  . Colonoscopy N/A 11/13/2014    Procedure: COLONOSCOPY;  Surgeon: Brad Houston, MD;  Location: [facility];  Service: Endoscopy;  Laterality: N/A;  730    SOCIAL HISTORY: History   Social History  . Marital Status: Widowed    Spouse Name: N/A  . Number of Children: N/A  . Years of Education: N/A   Occupational History  . retired    Social History Main Topics  . Smoking status: Light Tobacco Smoker    Types: Cigars  . Smokeless tobacco: Not on file  . Alcohol Use: No  . Drug Use: No  . Sexual Activity: Not on file   Other Topics Concern  .  Not on file   Social History Narrative  He is widowed, his wife was taken care of at [place] for 15 yrs.  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 No pertinent family history. indicated that his mother is deceased. He reported the following about his father: unknown.  Mother deceased, age 52, breast cancer   Only child  ALLERGIES:  has No Known Allergies.  MEDICATIONS:  Current Outpatient Prescriptions  Medication Sig Dispense Refill  . acetaminophen (TYLENOL) 325 MG tablet Take 650 mg by mouth every 6 (six) hours as needed for moderate pain.     . ferrous sulfate 325 (65 FE) MG tablet Take 325 mg by mouth daily with breakfast.    . Loperamide HCl ([employer] ANTI-DIARRHEA PO) Take 1 tablet by mouth daily as needed (diarrhea).    . LORazepam (ATIVAN) 1 MG tablet Take 1 mg by mouth every 6 (six) hours as needed for anxiety.     . rosuvastatin (CRESTOR) 20 MG tablet Take 20 mg by mouth daily.      . Sennosides (EX-LAX MAXIMUM STRENGTH) 25 MG TABS Take 1 tablet by mouth daily as needed (constipation).    Marland Kitchen lidocaine-prilocaine (EMLA) cream Apply a quarter size amount to port  site 1 hour prior to chemo. Do not rub in. Cover with plastic wrap. (Patient not taking: Reported on 12/05/2014) 30 g 0  . ondansetron (ZOFRAN) 8 MG tablet Take 1 tablet (8 mg total) by mouth every 8 (eight) hours as needed for nausea or vomiting. (Patient not taking: Reported on 12/05/2014) 30 tablet 1   No current facility-administered medications for this visit.    Review of Systems  Constitutional: Positive for weight loss.       15 lbs over 20 years. Mild confusion at times. No dentures  Cardiovascular: Positive for leg swelling.       Ankle edema  Gastrointestinal: Positive for blood in stool.       Positive for 4 months  Skin: Negative    Neurological: Positive for weakness.  All other systems reviewed and are negative.  14 point ROS was done and is otherwise as detailed above or in HPI   PHYSICAL EXAMINATION: ECOG PERFORMANCE STATUS: 1 - Symptomatic but completely ambulatory  Filed Vitals:   12/05/14 1142  BP: 117/68  Pulse: 88  Temp: 97.4 F (36.3 C)  Resp: 22   Filed Weights   12/05/14 1142  Weight: 141 lb 3.2 oz (64.048 kg)    Physical Exam  Constitutional: He is oriented to person, place, and time and well-developed, well-nourished, and in no distress.  Thin HENT:  Head: Normocephalic and atraumatic.  Nose: Nose normal.  Mouth/Throat: Oropharynx is clear and moist. No oropharyngeal exudate.  Eyes: Conjunctivae and EOM are normal. Pupils are equal, round, and reactive to light. Right eye exhibits no discharge. Left eye exhibits no discharge. No scleral icterus.  Neck: Normal range of motion. Neck supple. No tracheal deviation present. No thyromegaly present.  Cardiovascular: Normal rate and regular rhythm.  Exam reveals no gallop and no friction rub.   Murmur heard. Systolic ejection murmur  Pulmonary/Chest: Effort normal and breath sounds normal. He has no wheezes. He has no rales.  Abdominal: Soft. Bowel sounds are normal. He exhibits no distension and no mass. There is no tenderness. There is no rebound and no guarding.  Musculoskeletal: Normal range of motion. He exhibits no edema.  Lymphadenopathy:    He has no cervical adenopathy.  Neurological: He is alert and oriented to person, place, and time. He has normal reflexes. No cranial nerve deficit. Gait normal. Coordination normal.  Skin: Skin is warm and dry. No rash noted.  Psychiatric: Mood, memory, affect and judgment normal.  Nursing Sanchez and vitals reviewed.    LABORATORY DATA:  I have reviewed the data as listed Lab Results  Component Value Date   WBC 9.1 11/21/2014   HGB 11.9* 11/21/2014   HCT 37.5* 11/21/2014   MCV 83.5 11/21/2014    PLT 231 11/21/2014   Results for Brad, Sanchez (MRN [id number]) as of 11/28/2014 12:00  Ref. Range 11/13/2014 09:55  CEA Latest Ref Range: 0.0-4.7 ng/mL 0.9   RADIOGRAPHIC STUDIES: I have personally reviewed the radiological images as listed and agreed with the findings in the report.  CLINICAL DATA: Newly diagnosed rectal carcinoma by colonoscopy. Rectal bleeding. 15 lb weight loss over past several months.  EXAM: CT CHEST, ABDOMEN, AND PELVIS WITH CONTRAST  TECHNIQUE: Multidetector CT imaging of the chest, abdomen and pelvis was performed following the standard protocol during bolus administration of intravenous contrast.  CONTRAST: 120m OMNIPAQUE IOHEXOL 300 MG/ML SOLN  COMPARISON: None.  FINDINGS: CT CHEST FINDINGS  IMPRESSION: Bulky annular soft tissue mass involving the rectum measuring approximately  5.6 x 5.5 x 6.5 cm, consistent with newly diagnosed rectal carcinoma.  No evidence of pelvic lymphadenopathy or distant metastatic disease.  Sigmoid diverticulosis. No radiographic evidence of diverticulitis.   Electronically Signed  By: Earle Gell M.D.  On: 11/13/2014 13:07    ASSESSMENT & PLAN:  Rectal Adenocarcinoma CT C/A/P on 11/13/2014 without distant metastatic disease Lower EUS scheduled with Dr. Ardis Hughs  Mild Dementia Weight loss  He has met with radiation oncology. He is scheduled for port placement and EUS with Dr. Arnoldo Morale for more definitive staging. They have no additional questions today.  He wishes to be referred to a surgeon locally.  We will arrange for consultation with Dr. Arnoldo Morale.  He has had chemotherapy teaching.  Plan is for initiation of therapy on the 18th.  We will see him back one weeks post his first cycle.  All questions were answered. The patient knows to call the clinic with any problems, questions or concerns.   This Sanchez was electronically signed.    This document serves as a record of services  personally performed by Ancil Linsey, MD. It was created on her behalf by Arlyce Harman, a trained medical scribe. The creation of this record is based on the scribe's personal observations and the provider's statements to them. This document has been checked and approved by the attending provider.  I have reviewed the above documentation for accuracy and completeness, and I agree with the above.  Kelby Fam. Whitney Muse, MD

## 2014-12-05 NOTE — Patient Instructions (Signed)
[place] at [facility] Discharge Instructions  RECOMMENDATIONS MADE BY THE CONSULTANT AND ANY TEST RESULTS WILL BE SENT TO YOUR REFERRING PHYSICIAN.  Exam and discussion by Dr. Whitney Muse. Will get you scheduled to see Dr. Arnoldo Morale.  Treatment and follow-up as scheduled.  Thank you for choosing [place] at [facility] to provide your oncology and hematology care.  To afford each patient quality time with our provider, please arrive at least 15 minutes before your scheduled appointment time.    You need to re-schedule your appointment should you arrive 10 or more minutes late.  We strive to give you quality time with our providers, and arriving late affects you and other patients whose appointments are after yours.  Also, if you no show three or more times for appointments you may be dismissed from the clinic at the providers discretion.     Again, thank you for choosing [facility].  Our hope is that these requests will decrease the amount of time that you wait before being seen by our physicians.       _____________________________________________________________  Should you have questions after your visit to [facility], please contact our office at [phone] between the hours of 8:30 a.m. and 4:30 p.m.  Voicemails left after 4:30 p.m. will not be returned until the following business day.  For prescription refill requests, have your pharmacy contact our office.

## 2014-12-11 ENCOUNTER — Telehealth: Payer: Self-pay | Admitting: Gastroenterology

## 2014-12-11 ENCOUNTER — Encounter: Payer: Self-pay | Admitting: Gastroenterology

## 2014-12-11 NOTE — Telephone Encounter (Signed)
Left message on machine to call back  

## 2014-12-12 ENCOUNTER — Encounter (HOSPITAL_COMMUNITY)
Admission: RE | Disposition: A | Discharge status: Home / Self Care | Payer: Medicare Other | Source: Ambulatory Visit | Attending: Gastroenterology

## 2014-12-12 ENCOUNTER — Encounter (HOSPITAL_COMMUNITY): Payer: Self-pay | Admitting: *Deleted

## 2014-12-12 ENCOUNTER — Ambulatory Visit (HOSPITAL_COMMUNITY)
Admission: RE | Admit: 2014-12-12 | Discharge: 2014-12-12 | Disposition: A | Discharge status: Home / Self Care | Payer: Medicare Other | Source: Ambulatory Visit | Attending: Gastroenterology | Admitting: Gastroenterology

## 2014-12-12 DIAGNOSIS — F0390 Unspecified dementia without behavioral disturbance: Secondary | ICD-10-CM | POA: Diagnosis not present

## 2014-12-12 DIAGNOSIS — Z6822 Body mass index (BMI) 22.0-22.9, adult: Secondary | ICD-10-CM | POA: Insufficient documentation

## 2014-12-12 DIAGNOSIS — F17210 Nicotine dependence, cigarettes, uncomplicated: Secondary | ICD-10-CM | POA: Insufficient documentation

## 2014-12-12 DIAGNOSIS — R198 Other specified symptoms and signs involving the digestive system and abdomen: Secondary | ICD-10-CM

## 2014-12-12 DIAGNOSIS — R634 Abnormal weight loss: Secondary | ICD-10-CM | POA: Diagnosis not present

## 2014-12-12 DIAGNOSIS — C20 Malignant neoplasm of rectum: Secondary | ICD-10-CM | POA: Insufficient documentation

## 2014-12-12 DIAGNOSIS — K6289 Other specified diseases of anus and rectum: Secondary | ICD-10-CM

## 2014-12-12 HISTORY — PX: EUS: SHX5427

## 2014-12-12 SURGERY — ULTRASOUND, LOWER GI TRACT, ENDOSCOPIC
Anesthesia: Moderate Sedation

## 2014-12-12 MED ORDER — MIDAZOLAM HCL 5 MG/ML IJ SOLN
INTRAMUSCULAR | Status: AC
  Filled 2014-12-12: qty 2

## 2014-12-12 MED ORDER — FENTANYL CITRATE (PF) 100 MCG/2ML IJ SOLN
INTRAMUSCULAR | Status: AC
  Filled 2014-12-12: qty 2

## 2014-12-12 MED ORDER — FENTANYL CITRATE (PF) 100 MCG/2ML IJ SOLN
INTRAMUSCULAR | Status: DC | PRN
  Administered 2014-12-12 (×2): 25 ug via INTRAVENOUS

## 2014-12-12 MED ORDER — MIDAZOLAM HCL 10 MG/2ML IJ SOLN
INTRAMUSCULAR | Status: DC | PRN
  Administered 2014-12-12: 1 mg via INTRAVENOUS
  Administered 2014-12-12 (×2): 2 mg via INTRAVENOUS

## 2014-12-12 MED ORDER — SODIUM CHLORIDE 0.9 % IV SOLN
INTRAVENOUS | Status: DC
  Administered 2014-12-12: 500 mL via INTRAVENOUS

## 2014-12-12 NOTE — Op Note (Signed)
[facility] [place], [address]   OPERATIVE PROCEDURE REPORT  PATIENT: Daxen, Lanum  MR#: [id number] BIRTHDATE: 1935-03-28  GENDER: male ENDOSCOPIST: Milus Banister, MD REFERRED BY:  Ancil Linsey, MD PROCEDURE DATE:  12/12/2014 PROCEDURE:   Flexible sigmoidoscopy EUS ASA CLASS:   Class II INDICATIONS:1.  recently diagnosed rectal adenocarcinoma (colonoscopy Dr.  Laural Golden); imaging has shown no evidence of distant disease. MEDICATIONS: Fentanyl 50 mcg IV and Versed 5 mg IV  DESCRIPTION OF PROCEDURE:   After the risks benefits and alternatives of the procedure were thoroughly explained, informed consent was obtained.  Throughout the procedure, the patients blood pressure, pulse and oxygen saturations were monitored continuously. Under direct visualization, the Linear EUS [id number]  endoscope was introduced through the anus  and advanced to the sigmoid colon .  Water was used as necessary to provide an acoustic interface. Imaging was obtained at 7.5 and 12Mhz. Upon completion of the imaging, water was removed and the patient was sent to the recovery room in satisfactory condition. Estimated blood loss is zero unless otherwise noted in this procedure report.   Sigmoidoscopic findings: 1. Large, nearly circumferential, friable mass in the rectum. The mass is 5cm across, distal edge is about 1cm from the anal verge.   EUS findings: 1. The mass above corresponded with a hypoechoic, heterogeneous lesion that clearly passes into and through the muscularis propria layer of the rectal wall (uT3).  The mass measurs 1.9cm in depth. 2. There was one, small but suspicious perirectal lymphnode. This measured 26mm across, was round, well demarcated, hypoechoic (uN1)  ENDOSCOPIC IMPRESSION: 5cm, nearly circumferential uT3N1 (stage IIIb) rectal adenocarcinoma with distal edge located 1cm from internal anal verge  RECOMMENDATIONS: He will likely  benefit from neoadjuvant chemo/XRT   _______________________________ eSigned:  Milus Banister, MD 12/12/2014 1:57 PM   CC: Kyung Rudd, MD; Rhina Brackett, MD

## 2014-12-12 NOTE — Discharge Instructions (Signed)
YOU HAD AN ENDOSCOPIC PROCEDURE TODAY: Refer to the procedure report that was given to you for any specific questions about what was found during the examination.  If the procedure report does not answer your questions, please call your gastroenterologist to clarify.  YOU SHOULD EXPECT: Some feelings of bloating in the abdomen. Passage of more gas than usual.  Walking can help get rid of the air that was put into your GI tract during the procedure and reduce the bloating. If you had a lower endoscopy (such as a colonoscopy or flexible sigmoidoscopy) you may notice spotting of blood in your stool or on the toilet paper.   DIET: Your first meal following the procedure should be a light meal and then it is ok to progress to your normal diet.  A half-sandwich or bowl of soup is an example of a good first meal.  Heavy or fried foods are harder to digest and may make you feel nasueas or bloated.  Drink plenty of fluids but you should avoid alcoholic beverages for 24 hours.  ACTIVITY: Your care partner should take you home directly after the procedure.  You should plan to take it easy, moving slowly for the rest of the day.  You can resume normal activity the day after the procedure however you should NOT DRIVE or use heavy machinery for 24 hours (because of the sedation medicines used during the test).    SYMPTOMS TO REPORT IMMEDIATELY  A gastroenterologist can be reached at any hour.  Please call your doctor's office for any of the following symptoms:   Following lower endoscopy (colonoscopy, flexible sigmoidoscopy)  Excessive amounts of blood in the stool  Significant tenderness, worsening of abdominal pains  Swelling of the abdomen that is new, acute  Fever of 100 or higher  Following upper endoscopy (EGD, EUS, ERCP)  Vomiting of blood or coffee ground material  New, significant abdominal pain  New, significant chest pain or pain under the shoulder blades  Painful or persistently difficult  swallowing  New shortness of breath  Black, tarry-looking stools  FOLLOW UP: If any biopsies were taken you will be contacted by phone or by letter within the next 1-3 weeks.  Call your gastroenterologist if you have not heard about the biopsies in 3 weeks.  Please also call your gastroenterologist's office with any specific questions about appointments or follow up tests. Colonoscopy, Care After Refer to this sheet in the next few weeks. These instructions provide you with information on caring for yourself after your procedure. Your health care provider may also give you more specific instructions. Your treatment has been planned according to current medical practices, but problems sometimes occur. Call your health care provider if you have any problems or questions after your procedure. WHAT TO EXPECT AFTER THE PROCEDURE  After your procedure, it is typical to have the following:  A small amount of blood in your stool.  Moderate amounts of gas and mild abdominal cramping or bloating. HOME CARE INSTRUCTIONS  Do not drive, operate machinery, or sign important documents for 24 hours.  You may shower and resume your regular physical activities, but move at a slower pace for the first 24 hours.  Take frequent rest periods for the first 24 hours.  Walk around or put a warm pack on your abdomen to help reduce abdominal cramping and bloating.  Drink enough fluids to keep your urine clear or pale yellow.  You may resume your normal diet as instructed by your health  care provider. Avoid heavy or fried foods that are hard to digest.  Avoid drinking alcohol for 24 hours or as instructed by your health care provider.  Only take over-the-counter or prescription medicines as directed by your health care provider.  If a tissue sample (biopsy) was taken during your procedure:  Do not take aspirin or blood thinners for 7 days, or as instructed by your health care provider.  Do not drink alcohol  for 7 days, or as instructed by your health care provider.  Eat soft foods for the first 24 hours. SEEK MEDICAL CARE IF: You have persistent spotting of blood in your stool 2-3 days after the procedure. SEEK IMMEDIATE MEDICAL CARE IF:  You have more than a small spotting of blood in your stool.  You pass large blood clots in your stool.  Your abdomen is swollen (distended).  You have nausea or vomiting.  You have a fever.  You have increasing abdominal pain that is not relieved with medicine. Document Released: 01/06/2004 Document Revised: 03/14/2013 Document Reviewed: 01/29/2013 [facility] Patient Information 2015 [place], [place]. This information is not intended to replace advice given to you by your health care provider. Make sure you discuss any questions you have with your health care provider.

## 2014-12-12 NOTE — Telephone Encounter (Signed)
Pt had procedure.

## 2014-12-12 NOTE — H&P (View-Only) (Signed)
Brad Sanchez at [facility] NOTE  Patient Care Team: Marjean Donna, MD as PCP - General (Family Medicine)  CHIEF COMPLAINTS/PURPOSE OF CONSULTATION:  11/13/2014 Colonoscopy on 11/13/2014 with large distal rectal mass with noncritical luminal narrowing (Dr. Laural Golden) Biopsy c/w adenocarcinoma CT C/A/P on 11/13/2014 with bulky annular soft tissue mass involving the rectum measuring approximately 5.6 x 5.5 x 6.5 cm, c/w newly diagnosed rectal carcinoma. No evidence of metastatic disease  HISTORY OF PRESENTING ILLNESS:  Brad Sanchez 80 y.o. male is here because of a diagnosed adenocarcinoma of the rectum. His here today with his son Sherren Mocha and his daughter-in-law [name]. Currently lives with his son.  He has an upcoming endoscopy with Dr. Ardis Hughs in [place] on the 23rd.  Patient notes he is to do yard work and mow grass but becomes more fatigue than previously. He says he still does his yard work. He uses his riding lawnmower more. He meets with his friends every morning for breakfast and spends about 2 hours with them. He and his family note that his appetite is definitely decreased. Both think he has lost approximately 15 pounds over the last several months.  He learned of his cancer first from an overdue physical with Dr. Denton Lank.  A rectal exam was performed and he states that according to the doctor "something didn't feel right".  He was then referred to Dr. Laural Golden for a colonoscopy.  He was then referred to [facility].   He states that he noticed blood in stool before having the physical for about 4 months He says that he currently experiences no pain  He is going on vacation 7/10-7/14  He is adamant that he does not want any procedures or treatment to interfere with his planned vacation.   He has been taking OTC ferrous sulfate once daily.   MEDICAL HISTORY:  Past Medical History  Diagnosis Date  . High cholesterol   . Anxiety   . Heart murmur     Dr. Emilee Hero  told him 15 years ago  . Headache   . Cancer     rectal    SURGICAL HISTORY: Past Surgical History  Procedure Laterality Date  . Colonoscopy N/A 11/13/2014    Procedure: COLONOSCOPY;  Surgeon: Rogene Houston, MD;  Location: [facility];  Service: Endoscopy;  Laterality: N/A;  730    SOCIAL HISTORY: History   Social History  . Marital Status: Widowed    Spouse Name: N/A  . Number of Children: N/A  . Years of Education: N/A   Occupational History  . retired    Social History Main Topics  . Smoking status: Light Tobacco Smoker    Types: Cigars  . Smokeless tobacco: Not on file  . Alcohol Use: No  . Drug Use: No  . Sexual Activity: Not on file   Other Topics Concern  . Not on file   Social History Narrative  He is widowed, his wife was taken care of at [facility] for 15 yrs.  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 No pertinent family history. indicated that his mother is deceased. He reported the following about his father: unknown.  Mother deceased, age 43, breast cancer   Only child  ALLERGIES:  has No Known Allergies.  MEDICATIONS:  No current outpatient prescriptions on file.  No current facility-administered medications for this visit.    Review of Systems  Constitutional: Positive for weight loss.       15 lbs over 20 years No dentures  Cardiovascular: Positive for leg swelling.       Ankle edema  Gastrointestinal: Positive for blood in stool.       Positive for 4 months  Skin: Positive for itching.       Ankle edema  Neurological: Positive for weakness.  All other systems reviewed and are negative.  14 point ROS was done and is otherwise as detailed above or in HPI   PHYSICAL EXAMINATION: ECOG PERFORMANCE STATUS: 1 - Symptomatic but completely ambulatory  Filed Vitals:    11/21/14 1357  BP: 142/68  Pulse: 80  Temp: 98.2 F (36.8 C)  Resp: 18   Filed Weights   11/21/14 1357  Weight: 142 lb 9.6 oz (64.683 kg)    Physical Exam  Constitutional: He is oriented to person, place, and time and well-developed, well-nourished, and in no distress.  Thin, somewhat confused at times  HENT:  Head: Normocephalic and atraumatic.  Nose: Nose normal.  Mouth/Throat: Oropharynx is clear and moist. No oropharyngeal exudate.  Eyes: Conjunctivae and EOM are normal. Pupils are equal, round, and reactive to light. Right eye exhibits no discharge. Left eye exhibits no discharge. No scleral icterus.  Neck: Normal range of motion. Neck supple. No tracheal deviation present. No thyromegaly present.  Cardiovascular: Normal rate and regular rhythm.  Exam reveals no gallop and no friction rub.   Murmur heard. Systolic ejection murmur  Pulmonary/Chest: Effort normal and breath sounds normal. He has no wheezes. He has no rales.  Abdominal: Soft. Bowel sounds are normal. He exhibits no distension and no mass. There is no tenderness. There is no rebound and no guarding.  Musculoskeletal: Normal range of motion. He exhibits no edema.  Lymphadenopathy:    He has no cervical adenopathy.  Neurological: He is alert and oriented to person, place, and time. He has normal reflexes. No cranial nerve deficit. Gait normal. Coordination normal.  Skin: Skin is warm and dry. No rash noted.  Psychiatric: Mood, memory, affect and judgment normal.  Nursing note and vitals reviewed.    LABORATORY DATA:  I have reviewed the data as listed Lab Results  Component Value Date   WBC 9.1 11/21/2014   HGB 11.9* 11/21/2014   HCT 37.5* 11/21/2014   MCV 83.5 11/21/2014   PLT 231 11/21/2014   Results for RANULFO, KALL (MRN [id number]) as of 11/28/2014 12:00  Ref. Range 11/13/2014 09:55  CEA Latest Ref Range: 0.0-4.7 ng/mL 0.9   RADIOGRAPHIC STUDIES: I have personally reviewed the radiological  images as listed and agreed with the findings in the report.  CLINICAL DATA: Newly diagnosed rectal carcinoma by colonoscopy. Rectal bleeding. 15 lb weight loss over past several months.  EXAM: CT CHEST, ABDOMEN, AND PELVIS WITH CONTRAST  TECHNIQUE: Multidetector CT imaging of the chest, abdomen and pelvis was performed following the standard protocol during bolus administration of intravenous contrast.  CONTRAST: 140mL OMNIPAQUE IOHEXOL 300 MG/ML SOLN  COMPARISON: None.  FINDINGS: CT CHEST FINDINGS  IMPRESSION: Bulky annular soft tissue mass involving the rectum measuring approximately 5.6 x 5.5 x 6.5 cm, consistent with newly diagnosed rectal carcinoma.  No evidence of pelvic lymphadenopathy or distant metastatic disease.  Sigmoid diverticulosis. No radiographic evidence of diverticulitis.   Electronically Signed  By: Earle Gell M.D.  On: 11/13/2014 13:07    ASSESSMENT & PLAN:  Rectal Adenocarcinoma CT C/A/P on 11/13/2014 without distant metastatic disease Lower EUS scheduled with Dr. Ardis Hughs 6/23 Mild Dementia Weight loss  I discussed with the patient and his family in detail that he has a primary rectal cancer. We briefly reviewed the NCCN guidelines. I advised them that his staging is currently not complete and that his study with Dr. Ardis Hughs next week as part of the staging process. I reviewed this CT imaging of his chest abdomen and pelvis and advised them we do not have evidence that the cancer is outside of the rectal area.  We briefly reviewed the role of chemotherapy, radiation, and surgery in the multidisciplinary treatment of rectal cancer. I suspect based upon the description of his lesion from colonoscopy he may need neo-adjuvant therapy but I also advised he and his family that the EUS we will confirm this. He does not feel he can easily get to [facility] for radiation and prefers to be referred to [facility].  During our conversation the patient  emphasized he did NOT wish to start any treatment prior to his vacation which is planned on July 10 through July 14. I advised him we could work around this.  He will need Port-A-Cath placement and I discussed this with him. He will need formal chemotherapy teaching as well. He does agree to see radiation oncology prior to his vacation and we will make the appropriate referral.  I have advised the patient and family to write down any questions they have bring them to follow-up. We will check baseline laboratory studies today including CBC, CMP, serum iron studies, ferritin and baseline CEA if not previously obtained.  No problem-specific assessment & plan notes found for this encounter.  Orders Placed This Encounter  Procedures  . CBC with Differential  . Comprehensive metabolic panel  . Ferritin  . Iron and TIBC    All questions were answered. The patient knows to call the clinic with any problems, questions or concerns. //  This note was electronically signed.    This document serves as a record of services personally performed by Ancil Linsey, MD. It was created on her behalf by Janace Hoard, a trained medical scribe. The creation of this record is based on the scribe's personal observations and the provider's statements to them. This document has been checked and approved by the attending provider.  I have reviewed the above documentation for accuracy and completeness, and I agree with the above.  Kelby Fam. Whitney Muse, MD

## 2014-12-12 NOTE — Interval H&P Note (Signed)
History and Physical Interval Note:  12/12/2014 1:32 PM  Brad Sanchez  has presented today for surgery, with the diagnosis of rectal mass   The various methods of treatment have been discussed with the patient and family. After consideration of risks, benefits and other options for treatment, the patient has consented to  Procedure(s): LOWER ENDOSCOPIC ULTRASOUND (EUS) (N/A) as a surgical intervention .  The patient's history has been reviewed, patient examined, no change in status, stable for surgery.  I have reviewed the patient's chart and labs.  Questions were answered to the patient's satisfaction.     Milus Banister

## 2014-12-13 ENCOUNTER — Encounter (HOSPITAL_COMMUNITY): Payer: Self-pay | Admitting: Gastroenterology

## 2014-12-20 ENCOUNTER — Other Ambulatory Visit: Payer: Self-pay | Admitting: Radiology

## 2014-12-20 ENCOUNTER — Other Ambulatory Visit (HOSPITAL_COMMUNITY): Payer: Self-pay | Admitting: Hematology & Oncology

## 2014-12-21 ENCOUNTER — Encounter (HOSPITAL_COMMUNITY): Payer: Self-pay | Admitting: Hematology & Oncology

## 2014-12-23 ENCOUNTER — Ambulatory Visit (HOSPITAL_COMMUNITY)
Admission: RE | Admit: 2014-12-23 | Discharge: 2014-12-23 | Disposition: A | Discharge status: Home / Self Care | Payer: Medicare Other | Source: Ambulatory Visit | Attending: Hematology & Oncology | Admitting: Hematology & Oncology

## 2014-12-23 ENCOUNTER — Encounter (HOSPITAL_COMMUNITY): Payer: Self-pay

## 2014-12-23 ENCOUNTER — Other Ambulatory Visit (HOSPITAL_COMMUNITY): Payer: Self-pay | Admitting: Oncology

## 2014-12-23 ENCOUNTER — Ambulatory Visit (HOSPITAL_COMMUNITY)
Admission: RE | Admit: 2014-12-23 | Discharge: 2014-12-23 | Disposition: A | Discharge status: Home / Self Care | Payer: Medicare Other | Source: Ambulatory Visit | Attending: Oncology | Admitting: Oncology

## 2014-12-23 DIAGNOSIS — E780 Pure hypercholesterolemia: Secondary | ICD-10-CM | POA: Insufficient documentation

## 2014-12-23 DIAGNOSIS — F419 Anxiety disorder, unspecified: Secondary | ICD-10-CM | POA: Diagnosis not present

## 2014-12-23 DIAGNOSIS — C20 Malignant neoplasm of rectum: Secondary | ICD-10-CM | POA: Diagnosis not present

## 2014-12-23 DIAGNOSIS — F17210 Nicotine dependence, cigarettes, uncomplicated: Secondary | ICD-10-CM | POA: Diagnosis not present

## 2014-12-23 LAB — CBC
HCT: 35.9 % — ABNORMAL LOW (ref 39.0–52.0)
Hemoglobin: 11.3 g/dL — ABNORMAL LOW (ref 13.0–17.0)
MCH: 26.1 pg (ref 26.0–34.0)
MCHC: 31.5 g/dL (ref 30.0–36.0)
MCV: 82.9 fL (ref 78.0–100.0)
Platelets: 217 10*3/uL (ref 150–400)
RBC: 4.33 MIL/uL (ref 4.22–5.81)
RDW: 14.4 % (ref 11.5–15.5)
WBC: 7.1 10*3/uL (ref 4.0–10.5)

## 2014-12-23 LAB — BASIC METABOLIC PANEL
Anion gap: 6 (ref 5–15)
BUN: 14 mg/dL (ref 6–20)
CO2: 27 mmol/L (ref 22–32)
Calcium: 8.4 mg/dL — ABNORMAL LOW (ref 8.9–10.3)
Chloride: 106 mmol/L (ref 101–111)
Creatinine, Ser: 0.95 mg/dL (ref 0.61–1.24)
GFR calc Af Amer: >60 mL/min (ref >60)
GFR calc non Af Amer: >60 mL/min (ref >60)
GLUCOSE: 101 mg/dL — AB (ref 65–99)
Potassium: 4.2 mmol/L (ref 3.5–5.1)
SODIUM: 139 mmol/L (ref 135–145)

## 2014-12-23 LAB — PROTIME-INR
INR: 1.08 (ref 0.00–1.49)
PROTHROMBIN TIME: 14.2 s (ref 11.6–15.2)

## 2014-12-23 LAB — APTT: APTT: 27 s (ref 24–37)

## 2014-12-23 MED ORDER — CEFAZOLIN SODIUM-DEXTROSE 2-3 GM-% IV SOLR
INTRAVENOUS | Status: AC
  Filled 2014-12-23: qty 50

## 2014-12-23 MED ORDER — HEPARIN SOD (PORK) LOCK FLUSH 100 UNIT/ML IV SOLN
INTRAVENOUS | Status: AC
  Filled 2014-12-23: qty 5

## 2014-12-23 MED ORDER — CEFAZOLIN SODIUM-DEXTROSE 2-3 GM-% IV SOLR
2.0000 g | Freq: Once | INTRAVENOUS | Status: AC
  Administered 2014-12-23: 2 g via INTRAVENOUS

## 2014-12-23 MED ORDER — FENTANYL CITRATE (PF) 100 MCG/2ML IJ SOLN
INTRAMUSCULAR | Status: AC | PRN
  Administered 2014-12-23: 25 ug via INTRAVENOUS
  Administered 2014-12-23: 50 ug via INTRAVENOUS

## 2014-12-23 MED ORDER — SODIUM CHLORIDE 0.9 % IV SOLN
Freq: Once | INTRAVENOUS | Status: AC
  Administered 2014-12-23: 12:00:00 via INTRAVENOUS

## 2014-12-23 MED ORDER — HEPARIN SOD (PORK) LOCK FLUSH 100 UNIT/ML IV SOLN
INTRAVENOUS | Status: AC | PRN
  Administered 2014-12-23: 500 [IU]

## 2014-12-23 MED ORDER — FENTANYL CITRATE (PF) 100 MCG/2ML IJ SOLN
INTRAMUSCULAR | Status: AC
  Filled 2014-12-23: qty 4

## 2014-12-23 MED ORDER — MIDAZOLAM HCL 2 MG/2ML IJ SOLN
INTRAMUSCULAR | Status: AC | PRN
  Administered 2014-12-23 (×2): 1 mg via INTRAVENOUS
  Administered 2014-12-23: 0.5 mg via INTRAVENOUS
  Administered 2014-12-23: 1 mg via INTRAVENOUS

## 2014-12-23 MED ORDER — LIDOCAINE-EPINEPHRINE 2 %-1:100000 IJ SOLN
INTRAMUSCULAR | Status: AC
  Filled 2014-12-23: qty 1

## 2014-12-23 MED ORDER — MIDAZOLAM HCL 2 MG/2ML IJ SOLN
INTRAMUSCULAR | Status: AC
  Filled 2014-12-23: qty 6

## 2014-12-23 MED ORDER — LIDOCAINE HCL 1 % IJ SOLN
INTRAMUSCULAR | Status: AC
  Filled 2014-12-23: qty 20

## 2014-12-23 NOTE — Discharge Instructions (Signed)
Leave dressing on for 24 hours.  You may shower after 24 hours.  Please remove the dressing before you shower.   ° ° °Implanted Port Insertion, Care After °Refer to this sheet in the next few weeks. These instructions provide you with information on caring for yourself after your procedure. Your health care provider may also give you more specific instructions. Your treatment has been planned according to current medical practices, but problems sometimes occur. Call your health care provider if you have any problems or questions after your procedure. °WHAT TO EXPECT AFTER THE PROCEDURE °After your procedure, it is typical to have the following:  °· Discomfort at the port insertion site. Ice packs to the area will help. °· Bruising on the skin over the port. This will subside in 3-4 days. °HOME CARE INSTRUCTIONS °· After your port is placed, you will get a manufacturer's information card. The card has information about your port. Keep this card with you at all times.   °· Know what kind of port you have. There are many types of ports available.   °· Wear a medical alert bracelet in case of an emergency. This can help alert health care workers that you have a port.   °· The port can stay in for as long as your health care provider believes it is necessary.   °· A home health care nurse may give medicines and take care of the port.   °· You or a family member can get special training and directions for giving medicine and taking care of the port at home.   °SEEK MEDICAL CARE IF:  °· Your port does not flush or you are unable to get a blood return.   °· You have a fever or chills. °SEEK IMMEDIATE MEDICAL CARE IF: °· You have new fluid or pus coming from your incision.   °· You notice a bad smell coming from your incision site.   °· You have swelling, pain, or more redness at the incision or port site.   °· You have chest pain or shortness of breath. °Document Released: 03/14/2013 Document Revised: 05/29/2013 Document  Reviewed: 03/14/2013 °ExitCare® Patient Information ©2015 [employer]. This information is not intended to replace advice given to you by your health care provider. Make sure you discuss any questions you have with your health care provider. °Implanted Port Home Guide °An implanted port is a type of central line that is placed under the skin. Central lines are used to provide IV access when treatment or nutrition needs to be given through a person's veins. Implanted ports are used for long-term IV access. An implanted port may be placed because:  °· You need IV medicine that would be irritating to the small veins in your hands or arms.   °· You need long-term IV medicines, such as antibiotics.   °· You need IV nutrition for a long period.   °· You need frequent blood draws for lab tests.   °· You need dialysis.   °Implanted ports are usually placed in the chest area, but they can also be placed in the upper arm, the abdomen, or the leg. An implanted port has two main parts:  °· Reservoir. The reservoir is round and will appear as a small, raised area under your skin. The reservoir is the part where a needle is inserted to give medicines or draw blood.   °· Catheter. The catheter is a thin, flexible tube that extends from the reservoir. The catheter is placed into a large vein. Medicine that is inserted into the reservoir goes into   the catheter and then into the vein.   °HOW WILL I CARE FOR MY INCISION SITE? °Do not get the incision site wet. Bathe or shower as directed by your health care provider.  °HOW IS MY PORT ACCESSED? °Special steps must be taken to access the port:  °· Before the port is accessed, a numbing cream can be placed on the skin. This helps numb the skin over the port site.   °· Your health care provider uses a sterile technique to access the port. °· Your health care provider must put on a mask and sterile gloves. °· The skin over your port is cleaned carefully with an antiseptic and allowed to  dry. °· The port is gently pinched between sterile gloves, and a needle is inserted into the port. °· Only "non-coring" port needles should be used to access the port. Once the port is accessed, a blood return should be checked. This helps ensure that the port is in the vein and is not clogged.   °· If your port needs to remain accessed for a constant infusion, a clear (transparent) bandage will be placed over the needle site. The bandage and needle will need to be changed every week, or as directed by your health care provider.   °· Keep the bandage covering the needle clean and dry. Do not get it wet. Follow your health care provider's instructions on how to take a shower or bath while the port is accessed.   °· If your port does not need to stay accessed, no bandage is needed over the port.   °WHAT IS FLUSHING? °Flushing helps keep the port from getting clogged. Follow your health care provider's instructions on how and when to flush the port. Ports are usually flushed with saline solution or a medicine called heparin. The need for flushing will depend on how the port is used.  °· If the port is used for intermittent medicines or blood draws, the port will need to be flushed:   °· After medicines have been given.   °· After blood has been drawn.   °· As part of routine maintenance.   °· If a constant infusion is running, the port may not need to be flushed.   °HOW LONG WILL MY PORT STAY IMPLANTED? °The port can stay in for as long as your health care provider thinks it is needed. When it is time for the port to come out, surgery will be done to remove it. The procedure is similar to the one performed when the port was put in.  °WHEN SHOULD I SEEK IMMEDIATE MEDICAL CARE? °When you have an implanted port, you should seek immediate medical care if:  °· You notice a bad smell coming from the incision site.   °· You have swelling, redness, or drainage at the incision site.   °· You have more swelling or pain at the  port site or the surrounding area.   °· You have a fever that is not controlled with medicine. °Document Released: 05/24/2005 Document Revised: 03/14/2013 Document Reviewed: 01/29/2013 °ExitCare® Patient Information ©2015 [employer]. This information is not intended to replace advice given to you by your health care provider. Make sure you discuss any questions you have with your health care provider. °Conscious Sedation, Adult, Care After °Refer to this sheet in the next few weeks. These instructions provide you with information on caring for yourself after your procedure. Your health care provider may also give you more specific instructions. Your treatment has been planned according to current medical practices, but problems sometimes occur.   Call your health care provider if you have any problems or questions after your procedure. °WHAT TO EXPECT AFTER THE PROCEDURE  °After your procedure: °· You may feel sleepy, clumsy, and have poor balance for several hours. °· Vomiting may occur if you eat too soon after the procedure. °HOME CARE INSTRUCTIONS °· Do not participate in any activities where you could become injured for at least 24 hours. Do not: °¨ Drive. °¨ Swim. °¨ Ride a bicycle. °¨ Operate heavy machinery. °¨ Cook. °¨ Use power tools. °¨ Climb ladders. °¨ Work from a high place. °· Do not make important decisions or sign legal documents until you are improved. °· If you vomit, drink water, juice, or soup when you can drink without vomiting. Make sure you have little or no nausea before eating solid foods. °· Only take over-the-counter or prescription medicines for pain, discomfort, or fever as directed by your health care provider. °· Make sure you and your family fully understand everything about the medicines given to you, including what side effects may occur. °· You should not drink alcohol, take sleeping pills, or take medicines that cause drowsiness for at least 24 hours. °· If you smoke, do not  smoke without supervision. °· If you are feeling better, you may resume normal activities 24 hours after you were sedated. °· Keep all appointments with your health care provider. °SEEK MEDICAL CARE IF: °· Your skin is pale or bluish in color. °· You continue to feel nauseous or vomit. °· Your pain is getting worse and is not helped by medicine. °· You have bleeding or swelling. °· You are still sleepy or feeling clumsy after 24 hours. °SEEK IMMEDIATE MEDICAL CARE IF: °· You develop a rash. °· You have difficulty breathing. °· You develop any type of allergic problem. °· You have a fever. °MAKE SURE YOU: °· Understand these instructions. °· Will watch your condition. °· Will get help right away if you are not doing well or get worse. °Document Released: 03/14/2013 Document Reviewed: 03/14/2013 °ExitCare® Patient Information ©2015 [employer]. This information is not intended to replace advice given to you by your health care provider. Make sure you discuss any questions you have with your health care provider. ° ° ° °

## 2014-12-23 NOTE — H&P (Signed)
Chief Complaint: "I'm here for a portacath"  Referring Physician(s): Kefalas,Thomas S  History of Present Illness: Brad Sanchez is a 80 y.o. male with rectal cancer who is to undergo neoadjuvant chemoradiation therapy prior to potential surgery. He is referred for port placement. Has been NPO this am. Feeling well otherwise, no recent fevers, chills, illness.  Past Medical History  Diagnosis Date  . High cholesterol   . Anxiety   . Heart murmur     Dr. Emilee Hero told him 15 years ago  . Headache   . Cancer     rectal    Past Surgical History  Procedure Laterality Date  . Colonoscopy N/A 11/13/2014    Procedure: COLONOSCOPY;  Surgeon: Rogene Houston, MD;  Location: [facility];  Service: Endoscopy;  Laterality: N/A;  730  . Eus N/A 12/12/2014    Procedure: LOWER ENDOSCOPIC ULTRASOUND (EUS);  Surgeon: Milus Banister, MD;  Location: [facility];  Service: Endoscopy;  Laterality: N/A;    Allergies: Review of patient's allergies indicates no known allergies.  Medications: Prior to Admission medications   Medication Sig Start Date End Date Taking? Authorizing Provider  acetaminophen (TYLENOL) 325 MG tablet Take 650 mg by mouth every 6 (six) hours as needed for moderate pain.    Yes Historical Provider, MD  Loperamide HCl ([employer] ANTI-DIARRHEA PO) Take 1 tablet by mouth daily as needed (diarrhea).   Yes Historical Provider, MD  LORazepam (ATIVAN) 1 MG tablet Take 1 mg by mouth daily as needed for anxiety or sleep (Take at bedtime).    Yes Historical Provider, MD  rosuvastatin (CRESTOR) 20 MG tablet Take 20 mg by mouth daily.     Yes Historical Provider, MD  Sennosides (EX-LAX MAXIMUM STRENGTH) 25 MG TABS Take 1 tablet by mouth daily as needed (constipation).   Yes Historical Provider, MD  lidocaine-prilocaine (EMLA) cream Apply a quarter size amount to port site 1 hour prior to chemo. Do not rub in. Cover with plastic wrap. Patient not taking: Reported on 12/05/2014  12/02/14   Patrici Ranks, MD  ondansetron (ZOFRAN) 8 MG tablet Take 1 tablet (8 mg total) by mouth every 8 (eight) hours as needed for nausea or vomiting. Patient not taking: Reported on 12/05/2014 12/02/14   Patrici Ranks, MD     History reviewed. No pertinent family history.  History   Social History  . Marital Status: Widowed    Spouse Name: N/A  . Number of Children: N/A  . Years of Education: N/A   Occupational History  . retired    Social History Main Topics  . Smoking status: Light Tobacco Smoker    Types: Cigars  . Smokeless tobacco: Not on file  . Alcohol Use: No  . Drug Use: No  . Sexual Activity: Not on file   Other Topics Concern  . None   Social History Narrative    Review of Systems: A 12 point ROS discussed and pertinent positives are indicated in the HPI above.  All other systems are negative.  Review of Systems  Vital Signs: BP 126/63 mmHg  Pulse 80  Temp(Src) 98.1 F (36.7 C) (Oral)  Resp 18  Ht 5\' 6"  (1.676 m)  Wt 145 lb (65.772 kg)  BMI 23.41 kg/m2  SpO2 100%  Physical Exam  Constitutional: He is oriented to person, place, and time. He appears well-developed and well-nourished. No distress.  HENT:  Mouth/Throat: Oropharynx is clear and moist.  Neck: Normal range of motion.  No JVD present. No tracheal deviation present.  Cardiovascular: Normal rate, regular rhythm and normal heart sounds.   Pulmonary/Chest: Effort normal and breath sounds normal. No respiratory distress.  Abdominal: Soft. Bowel sounds are normal.  Neurological: He is alert and oriented to person, place, and time.  Psychiatric: He has a normal mood and affect. Judgment normal.   Labs:  CBC:  Recent Labs  11/13/14 0955 11/21/14 1519 12/23/14 1205  WBC 6.8 9.1 7.1  HGB 10.7* 11.9* 11.3*  HCT 33.5* 37.5* 35.9*  PLT 190 231 217    COAGS:  Recent Labs  12/23/14 1205  INR 1.08  APTT 27    BMP:  Recent Labs  11/13/14 0955 11/21/14 1519  NA  --   135  K  --  4.4  CL  --  100*  CO2  --  28  GLUCOSE  --  105*  BUN  --  15  CALCIUM  --  8.5*  CREATININE 0.88 0.84  GFRNONAA >60 >60  GFRAA >60 >60    LIVER FUNCTION TESTS:  Recent Labs  11/21/14 1519  BILITOT 0.5  AST 18  ALT 12*  ALKPHOS 67  PROT 6.3*  ALBUMIN 3.2*    TUMOR MARKERS:  Recent Labs  11/13/14 0955  CEA 0.9    Assessment and Plan: Rectal cancer For port placement Labs reviewed, ok Risks and Benefits discussed with the patient including, but not limited to bleeding, infection, pneumothorax, or fibrin sheath development and need for additional procedures. All of the patient's questions were answered, patient is agreeable to proceed. Consent signed and in chart.   Thank you for this interesting consult.  I greatly enjoyed meeting Brad Sanchez and look forward to participating in their care.  SignedAscencion Dike 12/23/2014, 12:48 PM   I spent a total of 15 minutes in face to face in clinical consultation, greater than 50% of which was counseling/coordinating care for port placement.

## 2014-12-23 NOTE — Procedures (Signed)
RIJV PAC Tip SVC RA No comp/EBL

## 2014-12-24 ENCOUNTER — Encounter (HOSPITAL_COMMUNITY): Payer: Medicare Other | Attending: Hematology & Oncology | Admitting: Hematology & Oncology

## 2014-12-24 ENCOUNTER — Encounter (HOSPITAL_COMMUNITY): Payer: Self-pay | Admitting: Hematology & Oncology

## 2014-12-24 ENCOUNTER — Encounter: Payer: Self-pay | Admitting: *Deleted

## 2014-12-24 ENCOUNTER — Inpatient Hospital Stay (HOSPITAL_COMMUNITY): Payer: Medicare Other

## 2014-12-24 ENCOUNTER — Encounter (HOSPITAL_BASED_OUTPATIENT_CLINIC_OR_DEPARTMENT_OTHER): Payer: Medicare Other

## 2014-12-24 VITALS — BP 116/64 | HR 86 | Temp 97.7°F | Resp 20 | Wt 139.5 lb

## 2014-12-24 DIAGNOSIS — R634 Abnormal weight loss: Secondary | ICD-10-CM | POA: Diagnosis not present

## 2014-12-24 DIAGNOSIS — C20 Malignant neoplasm of rectum: Secondary | ICD-10-CM

## 2014-12-24 DIAGNOSIS — F0390 Unspecified dementia without behavioral disturbance: Secondary | ICD-10-CM | POA: Diagnosis not present

## 2014-12-24 DIAGNOSIS — Z5111 Encounter for antineoplastic chemotherapy: Secondary | ICD-10-CM | POA: Diagnosis not present

## 2014-12-24 LAB — DIFFERENTIAL
Basophils Absolute: 0 10*3/uL (ref 0.0–0.1)
Basophils Relative: 0 % (ref 0–1)
Eosinophils Absolute: 0.1 10*3/uL (ref 0.0–0.7)
Eosinophils Relative: 1 % (ref 0–5)
Lymphocytes Relative: 25 % (ref 12–46)
Lymphs Abs: 1.7 10*3/uL (ref 0.7–4.0)
Monocytes Absolute: 0.6 10*3/uL (ref 0.1–1.0)
Monocytes Relative: 8 % (ref 3–12)
Neutro Abs: 4.5 10*3/uL (ref 1.7–7.7)
Neutrophils Relative %: 66 % (ref 43–77)

## 2014-12-24 LAB — COMPREHENSIVE METABOLIC PANEL
ALBUMIN: 2.9 g/dL — AB (ref 3.5–5.0)
ALT: 12 U/L — ABNORMAL LOW (ref 17–63)
ANION GAP: 7 (ref 5–15)
AST: 19 U/L (ref 15–41)
Alkaline Phosphatase: 71 U/L (ref 38–126)
BUN: 16 mg/dL (ref 6–20)
CO2: 26 mmol/L (ref 22–32)
Calcium: 8.3 mg/dL — ABNORMAL LOW (ref 8.9–10.3)
Chloride: 107 mmol/L (ref 101–111)
Creatinine, Ser: 0.93 mg/dL (ref 0.61–1.24)
GLUCOSE: 122 mg/dL — AB (ref 65–99)
Potassium: 3.9 mmol/L (ref 3.5–5.1)
Sodium: 140 mmol/L (ref 135–145)
Total Bilirubin: 0.5 mg/dL (ref 0.3–1.2)
Total Protein: 5.6 g/dL — ABNORMAL LOW (ref 6.5–8.1)

## 2014-12-24 MED ORDER — SODIUM CHLORIDE 0.9 % IV SOLN
225.0000 mg/m2/d | INTRAVENOUS | Status: DC
  Administered 2014-12-24: 2750 mg via INTRAVENOUS
  Filled 2014-12-24: qty 55

## 2014-12-24 MED ORDER — SODIUM CHLORIDE 0.9 % IJ SOLN
10.0000 mL | INTRAMUSCULAR | Status: DC | PRN
  Administered 2014-12-24: 10 mL
  Filled 2014-12-24: qty 10

## 2014-12-24 NOTE — Patient Instructions (Signed)
[facility] Discharge Instructions for Patients Receiving Chemotherapy  Today you received the following chemotherapy agents 5FU continuous infusion pump.  To help prevent nausea and vomiting after your treatment, we encourage you to take your nausea medication as instructed. If you develop nausea and vomiting that is not controlled by your nausea medication, call the clinic. If it is after clinic hours your family physician or the after hours number for the clinic or go to the Emergency Department. BELOW ARE SYMPTOMS THAT SHOULD BE REPORTED IMMEDIATELY:  *FEVER GREATER THAN 101.0 F  *CHILLS WITH OR WITHOUT FEVER  NAUSEA AND VOMITING THAT IS NOT CONTROLLED WITH YOUR NAUSEA MEDICATION  *UNUSUAL SHORTNESS OF BREATH  *UNUSUAL BRUISING OR BLEEDING  TENDERNESS IN MOUTH AND THROAT WITH OR WITHOUT PRESENCE OF ULCERS  *URINARY PROBLEMS  *BOWEL PROBLEMS  UNUSUAL RASH Items with * indicate a potential emergency and should be followed up as soon as possible.  One of the nurses will contact you 24 hours after your treatment. Please let the nurse know about any problems that you may have experienced. Feel free to call the clinic you have any questions or concerns. The clinic phone number is [phone]. Return as scheduled.  I have been informed and understand all the instructions given to me. I know to contact the clinic, my physician, or go to the Emergency Department if any problems should occur. I do not have any questions at this time, but understand that I may call the clinic during office hours or the Patient Navigator at [phone] should I have any questions or need assistance in obtaining follow up care.    __________________________________________  _____________  __________ Signature of Patient or Authorized Representative            Date                   Time    __________________________________________ Nurse's Signature

## 2014-12-24 NOTE — Progress Notes (Signed)
Continuous infusion pump education provided to patient and family. Verbalized understanding. Patient ambulatory on discharge.

## 2014-12-24 NOTE — Progress Notes (Signed)
[place] at [facility] Progress Note  Patient Care Team: Marjean Donna, MD as PCP - General (Family Medicine)  CHIEF COMPLAINTS/PURPOSE OF CONSULTATION:  11/13/2014 Colonoscopy on 11/13/2014 with large distal rectal mass with noncritical luminal narrowing (Dr. Laural Golden) Biopsy c/w adenocarcinoma  CT C/A/P on 11/13/2014 with bulky annular soft tissue mass involving the rectum measuring approximately 5.6 x 5.5 x 6.5 cm, c/w newly diagnosed rectal carcinoma. No evidence of metastatic disease  Flexible sigmoidoscopy EUS with Dr. Ardis Hughs showing a large nearly circumferential friable mass in the rectum. The masses 5 cm across, distal edge about 1 cm from the anal verge. Lesion clearly passes into and through the muscularis propria layer of the rectal wall, 1.9 cm in depth uT3, 1 small but suspicious perirectal lymph node (uT3N1) Stage IIIb  HISTORY OF PRESENTING ILLNESS:  Brad Sanchez 80 y.o. male is here because of a diagnosed adenocarcinoma of the rectum. His here today with his daughter-in-law [name] and his son. Currently lives with his son.  The patient is present with his family.  They currently have no problems or questions related do his therapy treatment. He did go to [employer] and states he had a good time it was too hot. He is here today to begin infusional 5-FU, he will start day 1 of radiation therapy in [place].   MEDICAL HISTORY:  Past Medical History  Diagnosis Date  . High cholesterol   . Anxiety   . Heart murmur     Dr. Emilee Hero told him 15 years ago  . Headache   . Cancer     rectal    SURGICAL HISTORY: Past Surgical History  Procedure Laterality Date  . Colonoscopy N/A 11/13/2014    Procedure: COLONOSCOPY;  Surgeon: Rogene Houston, MD;  Location[facility];  Service: Endoscopy;  Laterality: N/A;  730  . Eus N/A 12/12/2014    Procedure: LOWER ENDOSCOPIC ULTRASOUND (EUS);  Surgeon: Milus Banister, MD;  Location: [facility];  Service: Endoscopy;   Laterality: N/A;    SOCIAL HISTORY: History   Social History  . Marital Status: Widowed    Spouse Name: N/A  . Number of Children: N/A  . Years of Education: N/A   Occupational History  . retired    Social History Main Topics  . Smoking status: Light Tobacco Smoker    Types: Cigars  . Smokeless tobacco: Not on file  . Alcohol Use: No  . Drug Use: No  . Sexual Activity: Not on file   Other Topics Concern  . Not on file   Social History Narrative  He is widowed, his wife was taken care of at [place] for 15 yrs.  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 No pertinent family history. indicated that his mother is deceased. He reported the following about his father: unknown.  Mother deceased, age 31, breast cancer   Only child  ALLERGIES:  has No Known Allergies.  MEDICATIONS:  Current Outpatient Prescriptions  Medication Sig Dispense Refill  . acetaminophen (TYLENOL) 325 MG tablet Take 650 mg by mouth every 6 (six) hours as needed for moderate pain.     . Loperamide HCl ([employer] ANTI-DIARRHEA PO) Take 1 tablet by mouth daily as needed (diarrhea).    . LORazepam (ATIVAN) 1 MG tablet Take 1 mg  by mouth daily as needed for anxiety or sleep (Take at bedtime).     . ondansetron (ZOFRAN) 8 MG tablet Take 1 tablet (8 mg total) by mouth every 8 (eight) hours as needed for nausea or vomiting. 30 tablet 1  . rosuvastatin (CRESTOR) 20 MG tablet Take 20 mg by mouth daily.      . Sennosides (EX-LAX MAXIMUM STRENGTH) 25 MG TABS Take 1 tablet by mouth daily as needed (constipation).    Marland Kitchen lidocaine-prilocaine (EMLA) cream APPLY QUARTER SIZE TO PORT SITE ONE HOUR PRIOR TO CHEMO. DO NOT RUB IN. COVER WITH PLASTIC WRAP. 30 g 6   No current facility-administered medications for this visit.    Review of Systems  Constitutional:  Positive for weight loss.       15 lbs over 20 years. Mild confusion at times. No dentures  Cardiovascular: Positive for leg swelling.       Ankle edema  Gastrointestinal: Positive for blood in stool.       Positive for 4 months  Skin: Negative  Neurological: Positive for weakness.  All other systems reviewed and are negative.  14 point ROS was done and is otherwise as detailed above or in HPI  PHYSICAL EXAMINATION: ECOG PERFORMANCE STATUS: 1 - Symptomatic but completely ambulatory  Filed Vitals:   12/24/14 0900  BP: 116/64  Pulse: 86  Temp: 97.7 F (36.5 C)  Resp: 20   Filed Weights   12/24/14 0900  Weight: 139 lb 8 oz (63.277 kg)    Physical Exam  Constitutional: He is oriented to person, place, and time and well-developed, well-nourished, and in no distress.  Thin HENT:  Head: Normocephalic and atraumatic.  Nose: Nose normal.  Mouth/Throat: Oropharynx is clear and moist. No oropharyngeal exudate.  Eyes: Conjunctivae and EOM are normal. Pupils are equal, round, and reactive to light. Right eye exhibits no discharge. Left eye exhibits no discharge. No scleral icterus.  Neck: Normal range of motion. Neck supple. No tracheal deviation present. No thyromegaly present.  Cardiovascular: Normal rate and regular rhythm.  Exam reveals no gallop and no friction rub.   Murmur heard. Systolic ejection murmur  Pulmonary/Chest: Effort normal and breath sounds normal. He has no wheezes. He has no rales.  Abdominal: Soft. Bowel sounds are normal. He exhibits no distension and no mass. There is no tenderness. There is no rebound and no guarding.  Musculoskeletal: Normal range of motion. He exhibits no edema.  Lymphadenopathy:    He has no cervical adenopathy.  Neurological: He is alert and oriented to person, place, and time. He has normal reflexes. No cranial nerve deficit. Gait normal. Coordination normal.  Skin: Skin is warm and dry. No rash noted.  Psychiatric: Mood, memory,  affect and judgment normal.  Nursing note and vitals reviewed.    LABORATORY DATA:  I have reviewed the data as listed Lab Results  Component Value Date   WBC 7.1 12/23/2014   HGB 11.3* 12/23/2014   HCT 35.9* 12/23/2014   MCV 82.9 12/23/2014   PLT 217 12/23/2014   Results for MASIYAH, ENGEN (MRN [id number]) as of 11/28/2014 12:00  Ref. Range 11/13/2014 09:55  CEA Latest Ref Range: 0.0-4.7 ng/mL 0.9   RADIOGRAPHIC STUDIES: I have personally reviewed the radiological images as listed and agreed with the findings in the report.  CLINICAL DATA: Newly diagnosed rectal carcinoma by colonoscopy. Rectal bleeding. 15 lb weight loss over past several months.  EXAM: CT CHEST, ABDOMEN, AND PELVIS WITH CONTRAST  TECHNIQUE: Multidetector  CT imaging of the chest, abdomen and pelvis was performed following the standard protocol during bolus administration of intravenous contrast.  CONTRAST: 131mL OMNIPAQUE IOHEXOL 300 MG/ML SOLN  COMPARISON: None.  FINDINGS: CT CHEST FINDINGS  IMPRESSION: Bulky annular soft tissue mass involving the rectum measuring approximately 5.6 x 5.5 x 6.5 cm, consistent with newly diagnosed rectal carcinoma.  No evidence of pelvic lymphadenopathy or distant metastatic disease.  Sigmoid diverticulosis. No radiographic evidence of diverticulitis.   Electronically Signed  By: Earle Gell M.D.  On: 11/13/2014 13:07    ASSESSMENT & PLAN:  Rectal Adenocarcinoma CT C/A/P on 11/13/2014 without distant metastatic disease uT3 N1 disease, Stage IIIB Mild Dementia Weight loss Neoadjuvant XRT/infusional 5-Fu to start 12/24/2015  He is here today to begin therapy with infusional 5-FU. Plan is for 42 days of infusional 5-FU during his radiation. He understands he will then go for definitive surgical therapy. I have explained to him in the past that he will need additional chemotherapy after surgery. We will continue to review his treatment plan  moving forward. We will see him back one weeks post his first cycle.  All questions were answered. The patient knows to call the clinic with any problems, questions or concerns.   This note was electronically signed.    This document serves as a record of services personally performed by Ancil Linsey, MD. It was created on her behalf by Janace Hoard, a trained medical scribe. The creation of this record is based on the scribe's personal observations and the provider's statements to them. This document has been checked and approved by the attending provider.  I have reviewed the above documentation for accuracy and completeness, and I agree with the above.  Kelby Fam. Whitney Muse, MD

## 2014-12-24 NOTE — Progress Notes (Signed)
[place] Clinical Social Work  Clinical Social Work was referred by [place] rounding for assessment of psychosocial needs due to new pt starting chemo.  Clinical Social Worker met with  patient at [facility] to offer support and assess for needs.  CSW introduced self and explained the role of CSW. Pt and family denied current concerns, but are agreeable to reach out as needed for additional concerns.   Clinical Social Work interventions: Education   Loren Racer, [place] Tuesdays 8:30-1pm Wednesdays 8:30-12pm  [phone]

## 2014-12-25 ENCOUNTER — Telehealth (HOSPITAL_COMMUNITY): Payer: Self-pay | Admitting: *Deleted

## 2014-12-26 ENCOUNTER — Encounter (HOSPITAL_COMMUNITY): Payer: Self-pay | Admitting: *Deleted

## 2014-12-26 NOTE — Telephone Encounter (Signed)
Message left for daughter-in-law Vickie inquiring about patient status with continuous infusion pump. Requested call back.

## 2014-12-26 NOTE — Progress Notes (Signed)
24 hr f/u with patient after initiation of continuous 5 fu pump. He reports no nausea or vomiting. States "I'm just sick of wearing this pump". He thinks that he has had pump on for 2 months, he has had pump on for 2 days and this was reinforced to patient.

## 2014-12-31 ENCOUNTER — Ambulatory Visit (HOSPITAL_COMMUNITY): Payer: Medicare Other | Admitting: Hematology & Oncology

## 2014-12-31 ENCOUNTER — Encounter (HOSPITAL_BASED_OUTPATIENT_CLINIC_OR_DEPARTMENT_OTHER): Payer: Medicare Other

## 2014-12-31 ENCOUNTER — Encounter (HOSPITAL_COMMUNITY): Payer: Self-pay | Admitting: Oncology

## 2014-12-31 ENCOUNTER — Encounter (HOSPITAL_BASED_OUTPATIENT_CLINIC_OR_DEPARTMENT_OTHER): Payer: Medicare Other | Admitting: Oncology

## 2014-12-31 VITALS — BP 132/59 | HR 95 | Temp 98.5°F | Resp 18 | Wt 136.6 lb

## 2014-12-31 DIAGNOSIS — R42 Dizziness and giddiness: Secondary | ICD-10-CM

## 2014-12-31 DIAGNOSIS — C20 Malignant neoplasm of rectum: Secondary | ICD-10-CM

## 2014-12-31 DIAGNOSIS — Z5111 Encounter for antineoplastic chemotherapy: Secondary | ICD-10-CM | POA: Diagnosis not present

## 2014-12-31 LAB — CBC WITH DIFFERENTIAL/PLATELET
Basophils Absolute: 0 10*3/uL (ref 0.0–0.1)
Basophils Relative: 0 % (ref 0–1)
EOS ABS: 0.1 10*3/uL (ref 0.0–0.7)
Eosinophils Relative: 1 % (ref 0–5)
HEMATOCRIT: 36 % — AB (ref 39.0–52.0)
Hemoglobin: 11.7 g/dL — ABNORMAL LOW (ref 13.0–17.0)
Lymphocytes Relative: 18 % (ref 12–46)
Lymphs Abs: 1.1 10*3/uL (ref 0.7–4.0)
MCH: 26.6 pg (ref 26.0–34.0)
MCHC: 32.5 g/dL (ref 30.0–36.0)
MCV: 81.8 fL (ref 78.0–100.0)
MONO ABS: 0.5 10*3/uL (ref 0.1–1.0)
Monocytes Relative: 9 % (ref 3–12)
NEUTROS ABS: 4.4 10*3/uL (ref 1.7–7.7)
NEUTROS PCT: 72 % (ref 43–77)
PLATELETS: 213 10*3/uL (ref 150–400)
RBC: 4.4 MIL/uL (ref 4.22–5.81)
RDW: 14.5 % (ref 11.5–15.5)
WBC: 6.1 10*3/uL (ref 4.0–10.5)

## 2014-12-31 LAB — COMPREHENSIVE METABOLIC PANEL
ALT: 14 U/L — AB (ref 17–63)
AST: 22 U/L (ref 15–41)
Albumin: 3.1 g/dL — ABNORMAL LOW (ref 3.5–5.0)
Alkaline Phosphatase: 63 U/L (ref 38–126)
Anion gap: 7 (ref 5–15)
BUN: 12 mg/dL (ref 6–20)
CO2: 25 mmol/L (ref 22–32)
Calcium: 8.2 mg/dL — ABNORMAL LOW (ref 8.9–10.3)
Chloride: 105 mmol/L (ref 101–111)
Creatinine, Ser: 0.98 mg/dL (ref 0.61–1.24)
GFR calc Af Amer: >60 mL/min (ref >60)
Glucose, Bld: 119 mg/dL — ABNORMAL HIGH (ref 65–99)
POTASSIUM: 3.8 mmol/L (ref 3.5–5.1)
Sodium: 137 mmol/L (ref 135–145)
Total Bilirubin: 0.5 mg/dL (ref 0.3–1.2)
Total Protein: 5.7 g/dL — ABNORMAL LOW (ref 6.5–8.1)

## 2014-12-31 MED ORDER — SODIUM CHLORIDE 0.9 % IV SOLN: Freq: Once | INTRAVENOUS | Status: AC

## 2014-12-31 MED ORDER — HEPARIN SOD (PORK) LOCK FLUSH 100 UNIT/ML IV SOLN
INTRAVENOUS | Status: AC
  Filled 2014-12-31: qty 5

## 2014-12-31 MED ORDER — HEPARIN SOD (PORK) LOCK FLUSH 100 UNIT/ML IV SOLN
500.0000 [IU] | Freq: Once | INTRAVENOUS | Status: AC | PRN
  Administered 2014-12-31: 500 [IU]

## 2014-12-31 MED ORDER — SODIUM CHLORIDE 0.9 % IV SOLN
Freq: Once | INTRAVENOUS | Status: AC
  Administered 2014-12-31: 12:00:00 via INTRAVENOUS

## 2014-12-31 MED ORDER — SODIUM CHLORIDE 0.9 % IJ SOLN: 10.0000 mL | INTRAMUSCULAR | Status: DC | PRN

## 2014-12-31 MED ORDER — SODIUM CHLORIDE 0.9 % IV SOLN
225.0000 mg/m2/d | INTRAVENOUS | Status: DC
  Administered 2014-12-31: 2750 mg via INTRAVENOUS
  Filled 2014-12-31: qty 55

## 2014-12-31 MED ORDER — SODIUM CHLORIDE 0.9 % IJ SOLN
10.0000 mL | Freq: Once | INTRAMUSCULAR | Status: AC
  Administered 2014-12-31: 10 mL via INTRAVENOUS

## 2014-12-31 NOTE — Progress Notes (Signed)
Brad Sanchez Tolerated IV fluids well today Discharged ambulatory with pump

## 2014-12-31 NOTE — Patient Instructions (Addendum)
..  [place] at [facility] Discharge Instructions  RECOMMENDATIONS MADE BY THE CONSULTANT AND ANY TEST RESULTS WILL BE SENT TO YOUR REFERRING PHYSICIAN.  Exam today per T. Kefalas PA-C  Return 8/2 as scheduled.  Make position changes slowly to reduce the dizziness you are having and try to drink lots of fluids  See dentist soon  Thank you for choosing [place] at [facility] to provide your oncology and hematology care.  To afford each patient quality time with our provider, please arrive at least 15 minutes before your scheduled appointment time.    You need to re-schedule your appointment should you arrive 10 or more minutes late.  We strive to give you quality time with our providers, and arriving late affects you and other patients whose appointments are after yours.  Also, if you no show three or more times for appointments you may be dismissed from the clinic at the providers discretion.     Again, thank you for choosing [facility].  Our hope is that these requests will decrease the amount of time that you wait before being seen by our physicians.       _____________________________________________________________  Should you have questions after your visit to [facility], please contact our office at [phone] between the hours of 8:30 a.m. and 4:30 p.m.  Voicemails left after 4:30 p.m. will not be returned until the following business day.  For prescription refill requests, have your pharmacy contact our office.

## 2014-12-31 NOTE — Patient Instructions (Signed)
[facility] Discharge Instructions for Patients Receiving Chemotherapy  Today you received the following chemotherapy agents 5FU continuous infusional pump You also received 1 liter of fluids today Follow up as scheduled Please call the clinic if you have any questions or concerns  To help prevent nausea and vomiting after your treatment, we encourage you to take your nausea medication    If you develop nausea and vomiting, or diarrhea that is not controlled by your medication, call the clinic.  The clinic phone number is [phone]. Office hours are Monday-Friday 8:30am-5:00pm.  BELOW ARE SYMPTOMS THAT SHOULD BE REPORTED IMMEDIATELY:  *FEVER GREATER THAN 101.0 F  *CHILLS WITH OR WITHOUT FEVER  NAUSEA AND VOMITING THAT IS NOT CONTROLLED WITH YOUR NAUSEA MEDICATION  *UNUSUAL SHORTNESS OF BREATH  *UNUSUAL BRUISING OR BLEEDING  TENDERNESS IN MOUTH AND THROAT WITH OR WITHOUT PRESENCE OF ULCERS  *URINARY PROBLEMS  *BOWEL PROBLEMS  UNUSUAL RASH Items with * indicate a potential emergency and should be followed up as soon as possible. If you have an emergency after office hours please contact your primary care physician or go to the nearest emergency department.  Please call the clinic during office hours if you have any questions or concerns.   You may also contact the Patient Navigator at [phone] should you have any questions or need assistance in obtaining follow up care. _____________________________________________________________________ Have you asked about our STAR program?    STAR stands for Survivorship Training and Rehabilitation, and this is a nationally recognized cancer care program that focuses on survivorship and rehabilitation.  Cancer and cancer treatments may cause problems, such as, pain, making you feel tired and keeping you from doing the things that you need or want to do. Cancer rehabilitation can help. Our goal is to reduce these  troubling effects and help you have the best quality of life possible.  You may receive a survey from a nurse that asks questions about your current state of health.  Based on the survey results, all eligible patients will be referred to the [facility] program for an evaluation so we can better serve you! A frequently asked questions sheet is available upon request.           '

## 2014-12-31 NOTE — Assessment & Plan Note (Signed)
Stage IIIB Rectal Cancer (T3N1a) undergoing concomitant chemoradiation with 5FU CI.  Pre-chemo labs as ordered.  Signs and symptoms suggestive of orthostatic hypotension.  Will provide IV NS today or later this week.  He is not on Antihypertensives.  Return as planned for follow-up.

## 2014-12-31 NOTE — Progress Notes (Signed)
Brad Hampshire, MD [place] [address]  Rectal cancer  CURRENT THERAPY: 5FU infusional infusion with XRT in neoadjuvant setting.  INTERVAL HISTORY: Brad Sanchez 80 y.o. male returns for followup of Stage IIIB rectal adenocarcinoma undergoing neoadjuvant XRT/infusional 5-FU beginning on 12/24/2014.    Rectal cancer   11/13/2014 Imaging CT CAP- Bulky annular soft tissue mass involving the rectum measuring approximately 5.6 x 5.5 x 6.5 cm, consistent with newly diagnosed rectal carcinoma. No evidence of pelvic lymphadenopathy or distant metastatic disease.   11/13/2014 Initial Diagnosis Diagnosis Rectum, biopsy, rectal mass - ADENOCARCINOMA.   11/13/2014 Tumor Marker CEA 0.9   12/12/2014 Imaging EUS- 5cm, nearly circumferential uT3N1 (stage IIIb) rectal adenocarcinoma with distal edge located 1cm from internal anal verge   12/24/2014 -  Chemotherapy 5-FU   12/24/2014 -  Radiation Therapy    He has tolerated therapy well without any specific chemotherapy-related complaints.    He notes dizziness.  He denies a noticeable pattern to it.  He reports that during these episodes, he spins, not his surroundings.  He denies any LOC.  He denies any nausea or vomiting.  I described the signs and symptoms of orthostatic hypotension to him and he agrees that this description sort of matches what he experiences.  His family who accompanies him reports that the dizziness started with the start of treatment.  He continues to drive a motor vehicle.  He is moving his bowels without any complaints today.  He reports decreased blood in stool.   Past Medical History  Diagnosis Date  . High cholesterol   . Anxiety   . Heart murmur     Dr. Emilee Hero told him 15 years ago  . Headache   . Cancer     rectal    has Rotator cuff syndrome of right shoulder and Rectal cancer on his problem list.     has No Known Allergies.  Current Outpatient Prescriptions on File Prior to  Visit  Medication Sig Dispense Refill  . acetaminophen (TYLENOL) 325 MG tablet Take 650 mg by mouth every 6 (six) hours as needed for moderate pain.     Marland Kitchen LORazepam (ATIVAN) 1 MG tablet Take 1 mg by mouth daily as needed for anxiety or sleep (Take at bedtime).     . rosuvastatin (CRESTOR) 20 MG tablet Take 20 mg by mouth daily.      Marland Kitchen lidocaine-prilocaine (EMLA) cream APPLY QUARTER SIZE TO PORT SITE ONE HOUR PRIOR TO CHEMO. DO NOT RUB IN. COVER WITH PLASTIC WRAP. (Patient not taking: Reported on 12/31/2014) 30 g 6  . Loperamide HCl ([facility] ANTI-DIARRHEA PO) Take 1 tablet by mouth daily as needed (diarrhea).    . ondansetron (ZOFRAN) 8 MG tablet Take 1 tablet (8 mg total) by mouth every 8 (eight) hours as needed for nausea or vomiting. (Patient not taking: Reported on 12/31/2014) 30 tablet 1  . Sennosides (EX-LAX MAXIMUM STRENGTH) 25 MG TABS Take 1 tablet by mouth daily as needed (constipation).     Current Facility-Administered Medications on File Prior to Visit  Medication Dose Route Frequency Provider Last Rate Last Dose  . 0.9 %  sodium chloride infusion   Intravenous Once [employer], PA-C      . sodium chloride 0.9 % injection 10 mL  10 mL Intravenous Once [facility], PA-C        Past Surgical History  Procedure Laterality Date  . Colonoscopy N/A 11/13/2014  Procedure: COLONOSCOPY;  Surgeon: Rogene Houston, MD;  Location: [facility];  Service: Endoscopy;  Laterality: N/A;  730  . Eus N/A 12/12/2014    Procedure: LOWER ENDOSCOPIC ULTRASOUND (EUS);  Surgeon: Milus Banister, MD;  Location: [facility];  Service: Endoscopy;  Laterality: N/A;    Denies any headaches, double vision, fevers, chills, night sweats, nausea, vomiting, diarrhea, constipation, chest pain, heart palpitations, shortness of breath, urinary pain, urinary burning, urinary frequency, hematuria.   PHYSICAL EXAMINATION  ECOG PERFORMANCE STATUS: 1 - Symptomatic but completely ambulatory  Filed Vitals:    12/31/14 1028  BP: 132/59  Pulse: 95  Temp: 98.5 F (36.9 C)  Resp: 18    GENERAL:alert, no distress, well developed, comfortable, cooperative, smiling and accompanied by family SKIN: skin color, texture, turgor are normal, no rashes or significant lesions HEAD: Normocephalic, No masses, lesions, tenderness or abnormalities EYES: normal, PERRLA, EOMI, Conjunctiva are pink and non-injected EARS: External ears normal OROPHARYNX:lips, buccal mucosa, and tongue normal and mucous membranes are moist  NECK: supple, no adenopathy, trachea midline LYMPH:  no palpable lymphadenopathy BREAST:not examined LUNGS: clear to auscultation  HEART: regular rate & rhythm, no murmurs and no gallops ABDOMEN:abdomen soft and normal bowel sounds BACK: Back symmetric, no curvature. EXTREMITIES:less then 2 second capillary refill, no joint deformities, effusion, or inflammation, no skin discoloration, no cyanosis  NEURO: alert & oriented x 3 with fluent speech, no focal   LABORATORY DATA: CBC    Component Value Date/Time   WBC 6.1 12/31/2014 0959   RBC 4.40 12/31/2014 0959   HGB 11.7* 12/31/2014 0959   HCT 36.0* 12/31/2014 0959   PLT 213 12/31/2014 0959   MCV 81.8 12/31/2014 0959   MCH 26.6 12/31/2014 0959   MCHC 32.5 12/31/2014 0959   RDW 14.5 12/31/2014 0959   LYMPHSABS 1.1 12/31/2014 0959   MONOABS 0.5 12/31/2014 0959   EOSABS 0.1 12/31/2014 0959   BASOSABS 0.0 12/31/2014 0959      Chemistry      Component Value Date/Time   NA 137 12/31/2014 0959   K 3.8 12/31/2014 0959   CL 105 12/31/2014 0959   CO2 25 12/31/2014 0959   BUN 12 12/31/2014 0959   CREATININE 0.98 12/31/2014 0959      Component Value Date/Time   CALCIUM 8.2* 12/31/2014 0959   ALKPHOS 63 12/31/2014 0959   AST 22 12/31/2014 0959   ALT 14* 12/31/2014 0959   BILITOT 0.5 12/31/2014 0959     Lab Results  Component Value Date   CEA 0.9 11/13/2014      PENDING LABS:   RADIOGRAPHIC STUDIES:  Ir Fluoro Guide Cv  Line Right  12/23/2014   CLINICAL DATA:  Rectal cancer  EXAM: TUNNEL POWER PORT PLACEMENT WITH SUBCUTANEOUS POCKET UTILIZING ULTRASOUND & FLOUROSCOPY  FLUOROSCOPY TIME:  18 seconds.  MEDICATIONS AND MEDICAL HISTORY: Versed 4 mg, Fentanyl  mcg.  As antibiotic prophylaxis, Ancef was ordered pre-procedure and administered intravenously within one hour of incision.  ANESTHESIA/SEDATION: Moderate sedation time: 24 minutes  CONTRAST:  None  PROCEDURE: After written informed consent was obtained, patient was placed in the supine position on angiographic table. The right neck and chest was prepped and draped in a sterile fashion. Lidocaine was utilized for local anesthesia. The right internal jugular vein was noted to be patent initially with ultrasound. Under sonographic guidance, a micropuncture needle was inserted into the right IJ vein (Ultrasound and fluoroscopic image documentation was performed). The needle was removed over an 018 wire  which was exchanged for a Amplatz. This was advanced into the IVC. An 8-French dilator was advanced over the Amplatz.  A small incision was made in the right upper chest over the anterior right second rib. Utilizing blunt dissection, a subcutaneous pocket was created in the caudal direction. The pocket was irrigated with a copious amount of sterile normal saline. The port catheter was tunneled from the chest incision, and out the neck incision. The reservoir was inserted into the subcutaneous pocket and secured with two 3-0 Ethilon stitches. A peel-away sheath was advanced over the Amplatz wire. The port catheter was cut to measure length and inserted through the peel-away sheath. The peel-away sheath was removed. The chest incision was closed with 3-0 Vicryl interrupted stitches for the subcutaneous tissue and a running of 4-0 Vicryl subcuticular stitch for the skin. The neck incision was closed with a 4-0 Vicryl subcuticular stitch. Derma-bond was applied to both surgical incisions.  The port reservoir was flushed and instilled with heparinized saline. No complications.  FINDINGS: A right IJ vein Port-A-Cath is in place with its tip at the cavoatrial junction.  COMPLICATIONS: None  IMPRESSION: Successful 8 French right internal jugular vein power port placement with its tip at the SVC/RA junction.   Electronically Signed   By: Marybelle Killings M.D.   On: 12/23/2014 15:52   Ir US Guide Vasc Access Right  12/23/2014   CLINICAL DATA:  Rectal cancer  EXAM: TUNNEL POWER PORT PLACEMENT WITH SUBCUTANEOUS POCKET UTILIZING ULTRASOUND & FLOUROSCOPY  FLUOROSCOPY TIME:  18 seconds.  MEDICATIONS AND MEDICAL HISTORY: Versed 4 mg, Fentanyl  mcg.  As antibiotic prophylaxis, Ancef was ordered pre-procedure and administered intravenously within one hour of incision.  ANESTHESIA/SEDATION: Moderate sedation time: 24 minutes  CONTRAST:  None  PROCEDURE: After written informed consent was obtained, patient was placed in the supine position on angiographic table. The right neck and chest was prepped and draped in a sterile fashion. Lidocaine was utilized for local anesthesia. The right internal jugular vein was noted to be patent initially with ultrasound. Under sonographic guidance, a micropuncture needle was inserted into the right IJ vein (Ultrasound and fluoroscopic image documentation was performed). The needle was removed over an 018 wire which was exchanged for a Amplatz. This was advanced into the IVC. An 8-French dilator was advanced over the Amplatz.  A small incision was made in the right upper chest over the anterior right second rib. Utilizing blunt dissection, a subcutaneous pocket was created in the caudal direction. The pocket was irrigated with a copious amount of sterile normal saline. The port catheter was tunneled from the chest incision, and out the neck incision. The reservoir was inserted into the subcutaneous pocket and secured with two 3-0 Ethilon stitches. A peel-away sheath was advanced over  the Amplatz wire. The port catheter was cut to measure length and inserted through the peel-away sheath. The peel-away sheath was removed. The chest incision was closed with 3-0 Vicryl interrupted stitches for the subcutaneous tissue and a running of 4-0 Vicryl subcuticular stitch for the skin. The neck incision was closed with a 4-0 Vicryl subcuticular stitch. Derma-bond was applied to both surgical incisions. The port reservoir was flushed and instilled with heparinized saline. No complications.  FINDINGS: A right IJ vein Port-A-Cath is in place with its tip at the cavoatrial junction.  COMPLICATIONS: None  IMPRESSION: Successful 8 French right internal jugular vein power port placement with its tip at the SVC/RA junction.   Electronically Signed  By: Marybelle Killings M.D.   On: 12/23/2014 15:52     PATHOLOGY:    ASSESSMENT AND PLAN:  Rectal cancer Stage IIIB Rectal Cancer (T3N1a) undergoing concomitant chemoradiation with 5FU CI.  Pre-chemo labs as ordered.  Signs and symptoms suggestive of orthostatic hypotension.  Will provide IV NS today or later this week.  He is not on Antihypertensives.  Return as planned for follow-up.    THERAPY PLAN:  Continue with treatment as planned along with symptom management.  All questions were answered. The patient knows to call the clinic with any problems, questions or concerns. We can certainly see the patient much sooner if necessary.  Patient and plan discussed with Dr. Ancil Linsey and she is in agreement with the aforementioned.   This note is electronically signed by: Doy Mince 12/31/2014 11:45 AM

## 2015-01-07 ENCOUNTER — Encounter (HOSPITAL_COMMUNITY): Payer: Self-pay | Admitting: Oncology

## 2015-01-07 ENCOUNTER — Encounter (HOSPITAL_COMMUNITY): Payer: Medicare Other | Attending: Hematology & Oncology

## 2015-01-07 ENCOUNTER — Encounter (HOSPITAL_BASED_OUTPATIENT_CLINIC_OR_DEPARTMENT_OTHER): Payer: Medicare Other | Admitting: Oncology

## 2015-01-07 VITALS — BP 126/63 | HR 80 | Temp 98.0°F | Resp 18 | Wt 138.2 lb

## 2015-01-07 VITALS — BP 145/57 | HR 65 | Temp 97.7°F | Resp 18

## 2015-01-07 DIAGNOSIS — Z5111 Encounter for antineoplastic chemotherapy: Secondary | ICD-10-CM | POA: Diagnosis not present

## 2015-01-07 DIAGNOSIS — C20 Malignant neoplasm of rectum: Secondary | ICD-10-CM

## 2015-01-07 DIAGNOSIS — R42 Dizziness and giddiness: Secondary | ICD-10-CM

## 2015-01-07 LAB — COMPREHENSIVE METABOLIC PANEL
ALT: 16 U/L — AB (ref 17–63)
AST: 22 U/L (ref 15–41)
Albumin: 3 g/dL — ABNORMAL LOW (ref 3.5–5.0)
Alkaline Phosphatase: 68 U/L (ref 38–126)
Anion gap: 4 — ABNORMAL LOW (ref 5–15)
BUN: 13 mg/dL (ref 6–20)
CALCIUM: 8.5 mg/dL — AB (ref 8.9–10.3)
CHLORIDE: 107 mmol/L (ref 101–111)
CO2: 28 mmol/L (ref 22–32)
CREATININE: 0.81 mg/dL (ref 0.61–1.24)
GFR calc Af Amer: >60 mL/min (ref >60)
GFR calc non Af Amer: >60 mL/min (ref >60)
Glucose, Bld: 108 mg/dL — ABNORMAL HIGH (ref 65–99)
Potassium: 4 mmol/L (ref 3.5–5.1)
SODIUM: 139 mmol/L (ref 135–145)
TOTAL PROTEIN: 5.8 g/dL — AB (ref 6.5–8.1)
Total Bilirubin: 0.3 mg/dL (ref 0.3–1.2)

## 2015-01-07 LAB — CBC WITH DIFFERENTIAL/PLATELET
BASOS PCT: 0 % (ref 0–1)
Basophils Absolute: 0 10*3/uL (ref 0.0–0.1)
Eosinophils Absolute: 0.1 10*3/uL (ref 0.0–0.7)
Eosinophils Relative: 2 % (ref 0–5)
HCT: 35.9 % — ABNORMAL LOW (ref 39.0–52.0)
Hemoglobin: 11.5 g/dL — ABNORMAL LOW (ref 13.0–17.0)
Lymphocytes Relative: 17 % (ref 12–46)
Lymphs Abs: 0.7 10*3/uL (ref 0.7–4.0)
MCH: 26.7 pg (ref 26.0–34.0)
MCHC: 32 g/dL (ref 30.0–36.0)
MCV: 83.3 fL (ref 78.0–100.0)
MONO ABS: 0.6 10*3/uL (ref 0.1–1.0)
MONOS PCT: 14 % — AB (ref 3–12)
Neutro Abs: 2.8 10*3/uL (ref 1.7–7.7)
Neutrophils Relative %: 67 % (ref 43–77)
Platelets: 152 10*3/uL (ref 150–400)
RBC: 4.31 MIL/uL (ref 4.22–5.81)
RDW: 15.4 % (ref 11.5–15.5)
WBC: 4.1 10*3/uL (ref 4.0–10.5)

## 2015-01-07 MED ORDER — SODIUM CHLORIDE 0.9 % IV SOLN
INTRAVENOUS | Status: DC
Start: 2015-01-07 — End: 2015-01-07
  Administered 2015-01-07: 11:00:00 via INTRAVENOUS

## 2015-01-07 MED ORDER — SODIUM CHLORIDE 0.9 % IV SOLN
225.0000 mg/m2/d | INTRAVENOUS | Status: DC
  Administered 2015-01-07: 2750 mg via INTRAVENOUS
  Filled 2015-01-07: qty 55

## 2015-01-07 NOTE — Assessment & Plan Note (Addendum)
Stage IIIB Rectal Cancer (T3N1a) undergoing concomitant chemoradiation with 5FU CI.  Pre-chemo labs as ordered.  Treatment today as planned.  I have ordered an additional 1 L of NS IV today due to history of orthostatic hypotension last week, improved with IV fluids.    Return next as planned for follow-up and treatment.

## 2015-01-07 NOTE — Progress Notes (Signed)
Tolerated IV fluids well. Ambulatory on discharge with continuous infusion pump intact.

## 2015-01-07 NOTE — Patient Instructions (Signed)
[place] at [facility] Discharge Instructions  RECOMMENDATIONS MADE BY THE CONSULTANT AND ANY TEST RESULTS WILL BE SENT TO YOUR REFERRING PHYSICIAN.  Treatment today with continuous 17fu infusion. 1L of IV fluid today. Return next week for office visit and chemotherapy.  Thank you for choosing [place] at [facility] to provide your oncology and hematology care.  To afford each patient quality time with our provider, please arrive at least 15 minutes before your scheduled appointment time.    You need to re-schedule your appointment should you arrive 10 or more minutes late.  We strive to give you quality time with our providers, and arriving late affects you and other patients whose appointments are after yours.  Also, if you no show three or more times for appointments you may be dismissed from the clinic at the providers discretion.     Again, thank you for choosing [facility].  Our hope is that these requests will decrease the amount of time that you wait before being seen by our physicians.       _____________________________________________________________  Should you have questions after your visit to [facility], please contact our office at [phone] between the hours of 8:30 a.m. and 4:30 p.m.  Voicemails left after 4:30 p.m. will not be returned until the following business day.  For prescription refill requests, have your pharmacy contact our office.

## 2015-01-07 NOTE — Progress Notes (Signed)
Brad Hampshire, MD [place] [address]  Rectal cancer  CURRENT THERAPY: 5FU infusional infusion with XRT in neoadjuvant setting.  INTERVAL HISTORY: Brad Sanchez 80 y.o. male returns for followup of Stage IIIB rectal adenocarcinoma undergoing neoadjuvant XRT/infusional 5-FU beginning on 12/24/2014.    Rectal cancer   11/13/2014 Imaging CT CAP- Bulky annular soft tissue mass involving the rectum measuring approximately 5.6 x 5.5 x 6.5 cm, consistent with newly diagnosed rectal carcinoma. No evidence of pelvic lymphadenopathy or distant metastatic disease.   11/13/2014 Initial Diagnosis Diagnosis Rectum, biopsy, rectal mass - ADENOCARCINOMA.   11/13/2014 Tumor Marker CEA 0.9   12/12/2014 Imaging EUS- 5cm, nearly circumferential uT3N1 (stage IIIb) rectal adenocarcinoma with distal edge located 1cm from internal anal verge   12/24/2014 -  Chemotherapy 5-FU   12/24/2014 -  Radiation Therapy    He continues to tolerate therapy well.  He notes that his dizziness is improved.    He denies any complaints today, other than the time dedicated to XRT.  He is agreeable to IV fluids today and I have ordered them today.   Past Medical History  Diagnosis Date  . High cholesterol   . Anxiety   . Heart murmur     Dr. Emilee Hero told him 15 years ago  . Headache   . Cancer     rectal    has Rotator cuff syndrome of right shoulder and Rectal cancer on his problem list.     has No Known Allergies.  Current Outpatient Prescriptions on File Prior to Visit  Medication Sig Dispense Refill  . acetaminophen (TYLENOL) 325 MG tablet Take 650 mg by mouth every 6 (six) hours as needed for moderate pain.     Marland Kitchen lidocaine-prilocaine (EMLA) cream APPLY QUARTER SIZE TO PORT SITE ONE HOUR PRIOR TO CHEMO. DO NOT RUB IN. COVER WITH PLASTIC WRAP. (Patient not taking: Reported on 12/31/2014) 30 g 6  . Loperamide HCl ([facility] ANTI-DIARRHEA PO) Take 1 tablet by mouth daily as needed  (diarrhea).    . LORazepam (ATIVAN) 1 MG tablet Take 1 mg by mouth daily as needed for anxiety or sleep (Take at bedtime).     . ondansetron (ZOFRAN) 8 MG tablet Take 1 tablet (8 mg total) by mouth every 8 (eight) hours as needed for nausea or vomiting. (Patient not taking: Reported on 12/31/2014) 30 tablet 1  . rosuvastatin (CRESTOR) 20 MG tablet Take 20 mg by mouth daily.      . Sennosides (EX-LAX MAXIMUM STRENGTH) 25 MG TABS Take 1 tablet by mouth daily as needed (constipation).     No current facility-administered medications on file prior to visit.    Past Surgical History  Procedure Laterality Date  . Colonoscopy N/A 11/13/2014    Procedure: COLONOSCOPY;  Surgeon: Rogene Houston, MD;  [facility]: [facility];  Service: Endoscopy;  Laterality: N/A;  730  . Eus N/A 12/12/2014    Procedure: LOWER ENDOSCOPIC ULTRASOUND (EUS);  Surgeon: Milus Banister, MD;  Location: [facility];  Service: Endoscopy;  Laterality: N/A;    Denies any headaches, double vision, fevers, chills, night sweats, nausea, vomiting, diarrhea, constipation, chest pain, heart palpitations, shortness of breath, urinary pain, urinary burning, urinary frequency, hematuria.   PHYSICAL EXAMINATION  ECOG PERFORMANCE STATUS: 1 - Symptomatic but completely ambulatory  Filed Vitals:   01/07/15 0931  BP: 126/63  Pulse: 80  Temp: 98 F (36.7 C)  Resp: 18  GENERAL:alert, no distress, well developed, comfortable, cooperative, smiling and accompanied by his daughter. SKIN: skin color, texture, turgor are normal, no rashes or significant lesions HEAD: Normocephalic, No masses, lesions, tenderness or abnormalities EYES: normal, PERRLA, EOMI, Conjunctiva are pink and non-injected EARS: External ears normal OROPHARYNX:lips, buccal mucosa, and tongue normal and mucous membranes are moist  NECK: supple, no adenopathy, trachea midline LYMPH:  no palpable lymphadenopathy BREAST:not examined LUNGS: clear to auscultation    HEART: regular rate & rhythm ABDOMEN:abdomen soft and normal bowel sounds BACK: Back symmetric, no curvature. EXTREMITIES:less then 2 second capillary refill, no joint deformities, effusion, or inflammation, no skin discoloration, no cyanosis  NEURO: alert & oriented x 3 with fluent speech, no focal   LABORATORY DATA: CBC    Component Value Date/Time   WBC 4.1 01/07/2015 0940   RBC 4.31 01/07/2015 0940   HGB 11.5* 01/07/2015 0940   HCT 35.9* 01/07/2015 0940   PLT 152 01/07/2015 0940   MCV 83.3 01/07/2015 0940   MCH 26.7 01/07/2015 0940   MCHC 32.0 01/07/2015 0940   RDW 15.4 01/07/2015 0940   LYMPHSABS 0.7 01/07/2015 0940   MONOABS 0.6 01/07/2015 0940   EOSABS 0.1 01/07/2015 0940   BASOSABS 0.0 01/07/2015 0940      Chemistry      Component Value Date/Time   NA 137 12/31/2014 0959   K 3.8 12/31/2014 0959   CL 105 12/31/2014 0959   CO2 25 12/31/2014 0959   BUN 12 12/31/2014 0959   CREATININE 0.98 12/31/2014 0959      Component Value Date/Time   CALCIUM 8.2* 12/31/2014 0959   ALKPHOS 63 12/31/2014 0959   AST 22 12/31/2014 0959   ALT 14* 12/31/2014 0959   BILITOT 0.5 12/31/2014 0959     Lab Results  Component Value Date   CEA 0.9 11/13/2014      PENDING LABS:   RADIOGRAPHIC STUDIES:  Ir Fluoro Guide Cv Line Right  12/23/2014   CLINICAL DATA:  Rectal cancer  EXAM: TUNNEL POWER PORT PLACEMENT WITH SUBCUTANEOUS POCKET UTILIZING ULTRASOUND & FLOUROSCOPY  FLUOROSCOPY TIME:  18 seconds.  MEDICATIONS AND MEDICAL HISTORY: Versed 4 mg, Fentanyl  mcg.  As antibiotic prophylaxis, Ancef was ordered pre-procedure and administered intravenously within one hour of incision.  ANESTHESIA/SEDATION: Moderate sedation time: 24 minutes  CONTRAST:  None  PROCEDURE: After written informed consent was obtained, patient was placed in the supine position on angiographic table. The right neck and chest was prepped and draped in a sterile fashion. Lidocaine was utilized for local  anesthesia. The right internal jugular vein was noted to be patent initially with ultrasound. Under sonographic guidance, a micropuncture needle was inserted into the right IJ vein (Ultrasound and fluoroscopic image documentation was performed). The needle was removed over an 018 wire which was exchanged for a Amplatz. This was advanced into the IVC. An 8-French dilator was advanced over the Amplatz.  A small incision was made in the right upper chest over the anterior right second rib. Utilizing blunt dissection, a subcutaneous pocket was created in the caudal direction. The pocket was irrigated with a copious amount of sterile normal saline. The port catheter was tunneled from the chest incision, and out the neck incision. The reservoir was inserted into the subcutaneous pocket and secured with two 3-0 Ethilon stitches. A peel-away sheath was advanced over the Amplatz wire. The port catheter was cut to measure length and inserted through the peel-away sheath. The peel-away sheath was removed. The chest incision was  closed with 3-0 Vicryl interrupted stitches for the subcutaneous tissue and a running of 4-0 Vicryl subcuticular stitch for the skin. The neck incision was closed with a 4-0 Vicryl subcuticular stitch. Derma-bond was applied to both surgical incisions. The port reservoir was flushed and instilled with heparinized saline. No complications.  FINDINGS: A right IJ vein Port-A-Cath is in place with its tip at the cavoatrial junction.  COMPLICATIONS: None  IMPRESSION: Successful 8 French right internal jugular vein power port placement with its tip at the SVC/RA junction.   Electronically Signed   By: Marybelle Killings M.D.   On: 12/23/2014 15:52   Ir US Guide Vasc Access Right  12/23/2014   CLINICAL DATA:  Rectal cancer  EXAM: TUNNEL POWER PORT PLACEMENT WITH SUBCUTANEOUS POCKET UTILIZING ULTRASOUND & FLOUROSCOPY  FLUOROSCOPY TIME:  18 seconds.  MEDICATIONS AND MEDICAL HISTORY: Versed 4 mg, Fentanyl  mcg.  As  antibiotic prophylaxis, Ancef was ordered pre-procedure and administered intravenously within one hour of incision.  ANESTHESIA/SEDATION: Moderate sedation time: 24 minutes  CONTRAST:  None  PROCEDURE: After written informed consent was obtained, patient was placed in the supine position on angiographic table. The right neck and chest was prepped and draped in a sterile fashion. Lidocaine was utilized for local anesthesia. The right internal jugular vein was noted to be patent initially with ultrasound. Under sonographic guidance, a micropuncture needle was inserted into the right IJ vein (Ultrasound and fluoroscopic image documentation was performed). The needle was removed over an 018 wire which was exchanged for a Amplatz. This was advanced into the IVC. An 8-French dilator was advanced over the Amplatz.  A small incision was made in the right upper chest over the anterior right second rib. Utilizing blunt dissection, a subcutaneous pocket was created in the caudal direction. The pocket was irrigated with a copious amount of sterile normal saline. The port catheter was tunneled from the chest incision, and out the neck incision. The reservoir was inserted into the subcutaneous pocket and secured with two 3-0 Ethilon stitches. A peel-away sheath was advanced over the Amplatz wire. The port catheter was cut to measure length and inserted through the peel-away sheath. The peel-away sheath was removed. The chest incision was closed with 3-0 Vicryl interrupted stitches for the subcutaneous tissue and a running of 4-0 Vicryl subcuticular stitch for the skin. The neck incision was closed with a 4-0 Vicryl subcuticular stitch. Derma-bond was applied to both surgical incisions. The port reservoir was flushed and instilled with heparinized saline. No complications.  FINDINGS: A right IJ vein Port-A-Cath is in place with its tip at the cavoatrial junction.  COMPLICATIONS: None  IMPRESSION: Successful 8 French right internal  jugular vein power port placement with its tip at the SVC/RA junction.   Electronically Signed   By: Marybelle Killings M.D.   On: 12/23/2014 15:52     PATHOLOGY:    ASSESSMENT AND PLAN:  Rectal cancer Stage IIIB Rectal Cancer (T3N1a) undergoing concomitant chemoradiation with 5FU CI.  Pre-chemo labs as ordered.  Treatment today as planned.  I have ordered an additional 1 L of NS IV today due to history of orthostatic hypotension last week, improved with IV fluids.    Return next as planned for follow-up and treatment.    THERAPY PLAN:  Continue with treatment as planned along with symptom management.  All questions were answered. The patient knows to call the clinic with any problems, questions or concerns. We can certainly see the patient much sooner if  necessary.  Patient and plan discussed with Dr. Ancil Linsey and she is in agreement with the aforementioned.   This note is electronically signed by: Robynn Pane, PA-C 01/07/2015 10:30 AM

## 2015-01-08 ENCOUNTER — Encounter: Payer: Self-pay | Admitting: Dietician

## 2015-01-08 NOTE — Progress Notes (Signed)
Patient had overheard about the Ensure/Boost program and asked if he was eligible.   Contacted Pt by Phone  Wt Readings from Last 10 Encounters:  01/07/15 138 lb 3.2 oz (62.687 kg)  12/31/14 136 lb 9.6 oz (61.961 kg)  12/24/14 139 lb 8 oz (63.277 kg)  12/23/14 145 lb (65.772 kg)  12/12/14 141 lb (63.957 kg)  12/05/14 141 lb 3.2 oz (64.048 kg)  11/21/14 142 lb 9.6 oz (64.683 kg)  11/12/14 142 lb (64.411 kg)  02/04/11 161 lb (73.029 kg)   Patient weight has mostly steady recently . It has been between 138-142 for the past couple months.   I got patients voicemail. I left a message stating that I had heard he was interested in the Ensure program. Looking over his chart he has had reported no side effects from treatment that would cause him to have poor intake and his weight has been stable. I told him that I do not feel he really needs Ensure at this point in time.   However, if he is interested in samples or coupons I would be happy to provide those for him. Mentioned we can re-visit this as his therapy progresses  Left my contact info if he would like to talk  Brad Sanchez RD, LDN Nutrition Pager: [phone] 01/08/2015 11:22 AM

## 2015-01-14 ENCOUNTER — Encounter (HOSPITAL_BASED_OUTPATIENT_CLINIC_OR_DEPARTMENT_OTHER): Payer: Medicare Other | Admitting: Hematology & Oncology

## 2015-01-14 ENCOUNTER — Encounter (HOSPITAL_BASED_OUTPATIENT_CLINIC_OR_DEPARTMENT_OTHER): Payer: Medicare Other

## 2015-01-14 DIAGNOSIS — Z5111 Encounter for antineoplastic chemotherapy: Secondary | ICD-10-CM | POA: Diagnosis not present

## 2015-01-14 DIAGNOSIS — C20 Malignant neoplasm of rectum: Secondary | ICD-10-CM

## 2015-01-14 DIAGNOSIS — Z803 Family history of malignant neoplasm of breast: Secondary | ICD-10-CM | POA: Diagnosis not present

## 2015-01-14 DIAGNOSIS — R634 Abnormal weight loss: Secondary | ICD-10-CM

## 2015-01-14 DIAGNOSIS — F0390 Unspecified dementia without behavioral disturbance: Secondary | ICD-10-CM | POA: Diagnosis not present

## 2015-01-14 LAB — CBC WITH DIFFERENTIAL/PLATELET
Basophils Absolute: 0 K/uL (ref 0.0–0.1)
Basophils Relative: 0 % (ref 0–1)
Eosinophils Absolute: 0.1 K/uL (ref 0.0–0.7)
Eosinophils Relative: 3 % (ref 0–5)
HCT: 36.2 % — ABNORMAL LOW (ref 39.0–52.0)
Hemoglobin: 11.6 g/dL — ABNORMAL LOW (ref 13.0–17.0)
Lymphocytes Relative: 17 % (ref 12–46)
Lymphs Abs: 0.7 K/uL (ref 0.7–4.0)
MCH: 26.7 pg (ref 26.0–34.0)
MCHC: 32 g/dL (ref 30.0–36.0)
MCV: 83.4 fL (ref 78.0–100.0)
Monocytes Absolute: 0.5 K/uL (ref 0.1–1.0)
Monocytes Relative: 11 % (ref 3–12)
Neutro Abs: 2.9 K/uL (ref 1.7–7.7)
Neutrophils Relative %: 69 % (ref 43–77)
Platelets: 143 K/uL — ABNORMAL LOW (ref 150–400)
RBC: 4.34 MIL/uL (ref 4.22–5.81)
RDW: 15.9 % — ABNORMAL HIGH (ref 11.5–15.5)
WBC: 4.2 K/uL (ref 4.0–10.5)

## 2015-01-14 LAB — COMPREHENSIVE METABOLIC PANEL
ALBUMIN: 3 g/dL — AB (ref 3.5–5.0)
ALK PHOS: 62 U/L (ref 38–126)
ALT: 11 U/L — ABNORMAL LOW (ref 17–63)
ANION GAP: 7 (ref 5–15)
AST: 16 U/L (ref 15–41)
BUN: 14 mg/dL (ref 6–20)
CO2: 27 mmol/L (ref 22–32)
CREATININE: 0.82 mg/dL (ref 0.61–1.24)
Calcium: 8.5 mg/dL — ABNORMAL LOW (ref 8.9–10.3)
Chloride: 105 mmol/L (ref 101–111)
GLUCOSE: 105 mg/dL — AB (ref 65–99)
POTASSIUM: 3.8 mmol/L (ref 3.5–5.1)
Sodium: 139 mmol/L (ref 135–145)
Total Bilirubin: 0.4 mg/dL (ref 0.3–1.2)
Total Protein: 5.8 g/dL — ABNORMAL LOW (ref 6.5–8.1)

## 2015-01-14 MED ORDER — SODIUM CHLORIDE 0.9 % IJ SOLN
10.0000 mL | INTRAMUSCULAR | Status: DC | PRN
  Administered 2015-01-14: 10 mL
  Filled 2015-01-14: qty 10

## 2015-01-14 MED ORDER — FLUOROURACIL CHEMO INJECTION 5 GM/100ML
225.0000 mg/m2/d | INTRAVENOUS | Status: DC
  Administered 2015-01-14: 2750 mg via INTRAVENOUS
  Filled 2015-01-14: qty 55

## 2015-01-14 NOTE — Progress Notes (Signed)
[place] at [facility] Progress Note  Patient Care Team: Marjean Donna, MD as PCP - General (Family Medicine)  CHIEF COMPLAINTS/PURPOSE OF CONSULTATION:  11/13/2014 Colonoscopy on 11/13/2014 with large distal rectal mass with noncritical luminal narrowing (Dr. Laural Golden) Biopsy c/w adenocarcinoma  CT C/A/P on 11/13/2014 with bulky annular soft tissue mass involving the rectum measuring approximately 5.6 x 5.5 x 6.5 cm, c/w newly diagnosed rectal carcinoma. No evidence of metastatic disease  Flexible sigmoidoscopy EUS with Dr. Ardis Hughs showing a large nearly circumferential friable mass in the rectum. The masses 5 cm across, distal edge about 1 cm from the anal verge. Lesion clearly passes into and through the muscularis propria layer of the rectal wall, 1.9 cm in depth uT3, 1 small but suspicious perirectal lymph node (uT3N1) Stage IIIb  HISTORY OF PRESENTING ILLNESS:  Brad Sanchez 80 y.o. male is here because of a diagnosed adenocarcinoma of the rectum.  He is here today with his son. He has been eating well and notes that he is hungry right now. He denies any vomiting or nausea.  He has not had enough soreness in his bottom from radiation to be concerned about. He denies mouth sores, nausea, diarrhea.   His pump began "beeping" several days ago. He states he knew something was wrong so he just clamped off the tubing.    MEDICAL HISTORY:  Past Medical History  Diagnosis Date  . High cholesterol   . Anxiety   . Heart murmur     Dr. Emilee Hero told him 15 years ago  . Headache   . Cancer     rectal    SURGICAL HISTORY: Past Surgical History  Procedure Laterality Date  . Colonoscopy N/A 11/13/2014    Procedure: COLONOSCOPY;  Surgeon: Rogene Houston, MD;  Location[facility];  Service: Endoscopy;  Laterality: N/A;  730  . Eus N/A 12/12/2014    Procedure: LOWER ENDOSCOPIC ULTRASOUND (EUS);  Surgeon: Milus Banister, MD;  Location: [facility];  Service:  Endoscopy;  Laterality: N/A;    SOCIAL HISTORY: Social History   Social History  . Marital Status: Widowed    Spouse Name: N/A  . Number of Children: N/A  . Years of Education: N/A   Occupational History  . retired    Social History Main Topics  . Smoking status: Light Tobacco Smoker    Types: Cigars  . Smokeless tobacco: Not on file  . Alcohol Use: No  . Drug Use: No  . Sexual Activity: Not on file   Other Topics Concern  . Not on file   Social History Narrative  He is widowed, his wife was taken care of at [place] for 15 yrs.  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 indicated that his mother is deceased. He reported the following about his father: unknown.  Mother deceased, age 19, breast cancer   Only child  ALLERGIES:  has No Known Allergies.  MEDICATIONS:  Current Outpatient Prescriptions  Medication Sig Dispense Refill  . acetaminophen (TYLENOL) 325 MG tablet Take 650 mg by mouth every 6 (six) hours as needed for moderate pain.     Marland Kitchen lidocaine-prilocaine (EMLA) cream APPLY QUARTER SIZE TO PORT SITE ONE HOUR PRIOR TO CHEMO. DO NOT RUB IN. COVER WITH PLASTIC WRAP. 30 g  6  . Loperamide HCl ([employer] ANTI-DIARRHEA PO) Take 1 tablet by mouth daily as needed (diarrhea).    . LORazepam (ATIVAN) 1 MG tablet Take 1 mg by mouth daily as needed for anxiety or sleep (Take at bedtime).     . ondansetron (ZOFRAN) 8 MG tablet Take 1 tablet (8 mg total) by mouth every 8 (eight) hours as needed for nausea or vomiting. 30 tablet 1  . rosuvastatin (CRESTOR) 20 MG tablet Take 20 mg by mouth daily.      . Sennosides (EX-LAX MAXIMUM STRENGTH) 25 MG TABS Take 1 tablet by mouth daily as needed (constipation).     No current facility-administered medications for this visit.    Review of Systems  Constitutional:  Negative.  Mild confusion at times. Cardiovascular: Negative Gastrointestinal: Negative Skin: Negative  Neurological: Negative.  All other systems reviewed and are negative.  14 point ROS was done and is otherwise as detailed above or in HPI  PHYSICAL EXAMINATION: ECOG PERFORMANCE STATUS: 1 - Symptomatic but completely ambulatory  There were no vitals filed for this visit. There were no vitals filed for this visit.  Physical Exam  Constitutional: He is oriented to person, place, and time and well-developed, well-nourished, and in no distress.  Thin HENT:  Head: Normocephalic and atraumatic.  Nose: Nose normal.  Mouth/Throat: Oropharynx is clear and moist. No oropharyngeal exudate.  Eyes: Conjunctivae and EOM are normal. Pupils are equal, round, and reactive to light. Right eye exhibits no discharge. Left eye exhibits no discharge. No scleral icterus.  Neck: Normal range of motion. Neck supple. No tracheal deviation present. No thyromegaly present.  Cardiovascular: Normal rate and regular rhythm.  Exam reveals no gallop and no friction rub.   Murmur heard. Systolic ejection murmur  Pulmonary/Chest: Effort normal and breath sounds normal. He has no wheezes. He has no rales.  Abdominal: Soft. Bowel sounds are normal. He exhibits no distension and no mass. There is no tenderness. There is no rebound and no guarding.  Musculoskeletal: Normal range of motion. He exhibits no edema.  Lymphadenopathy:    He has no cervical adenopathy.  Neurological: He is alert and oriented to person, place, and time. He has normal reflexes. No cranial nerve deficit. Gait normal. Coordination normal.  Skin: Skin is warm and dry. No rash noted.  Psychiatric: Mood, memory, affect and judgment normal.  Nursing note and vitals reviewed.    LABORATORY DATA:  I have reviewed the data as listed Lab Results  Component Value Date   WBC 4.2 01/14/2015   HGB 11.6* 01/14/2015   HCT 36.2* 01/14/2015   MCV  83.4 01/14/2015   PLT 143* 01/14/2015   CMP     Component Value Date/Time   NA 139 01/14/2015 0910   K 3.8 01/14/2015 0910   CL 105 01/14/2015 0910   CO2 27 01/14/2015 0910   GLUCOSE 105* 01/14/2015 0910   BUN 14 01/14/2015 0910   CREATININE 0.82 01/14/2015 0910   CALCIUM 8.5* 01/14/2015 0910   PROT 5.8* 01/14/2015 0910   ALBUMIN 3.0* 01/14/2015 0910   AST 16 01/14/2015 0910   ALT 11* 01/14/2015 0910   ALKPHOS 62 01/14/2015 0910   BILITOT 0.4 01/14/2015 0910   GFRNONAA >60 01/14/2015 0910   GFRAA >60 01/14/2015 0910      Results for FOSTER, FRERICKS (MRN [id number]) as of 11/28/2014 12:00  Ref. Range 11/13/2014 09:55  CEA Latest Ref Range: 0.0-4.7 ng/mL 0.9   RADIOGRAPHIC STUDIES: I have personally reviewed the  radiological images as listed and agreed with the findings in the report.  CLINICAL DATA: Newly diagnosed rectal carcinoma by colonoscopy. Rectal bleeding. 15 lb weight loss over past several months.  EXAM: CT CHEST, ABDOMEN, AND PELVIS WITH CONTRAST  TECHNIQUE: Multidetector CT imaging of the chest, abdomen and pelvis was performed following the standard protocol during bolus administration of intravenous contrast.  CONTRAST: 174mL OMNIPAQUE IOHEXOL 300 MG/ML SOLN  COMPARISON: None.  FINDINGS: CT CHEST FINDINGS  IMPRESSION: Bulky annular soft tissue mass involving the rectum measuring approximately 5.6 x 5.5 x 6.5 cm, consistent with newly diagnosed rectal carcinoma.  No evidence of pelvic lymphadenopathy or distant metastatic disease.  Sigmoid diverticulosis. No radiographic evidence of diverticulitis.   Electronically Signed  By: Earle Gell M.D.  On: 11/13/2014 13:07    ASSESSMENT & PLAN:  Rectal Adenocarcinoma CT C/A/P on 11/13/2014 without distant metastatic disease uT3 N1 disease, Stage IIIB Mild Dementia Weight loss Neoadjuvant XRT/infusional 5-Fu to start 12/24/2015  He is doing fairly well with treatment. No  major complaints today. The patient was instructed however that if he notices any problems with his infusion pump he is to come to the clinic during the day or to the ER in the evenings or weekends. He did not receive his full weekly dose of 5-FU. I explained to him the importance of receiving his chemotherapy dose.  We will plan on return next week for additional physical exam and review. He is cleared for chemotherapy today.  All questions were answered. The patient knows to call the clinic with any problems, questions or concerns.   This note was electronically signed.    This document serves as a record of services personally performed by Ancil Linsey, MD. It was created on her behalf by Arlyce Harman, a trained medical scribe. The creation of this record is based on the scribe's personal observations and the provider's statements to them. This document has been checked and approved by the attending provider.  I have reviewed the above documentation for accuracy and completeness, and I agree with the above.  Kelby Fam. Whitney Muse, MD

## 2015-01-14 NOTE — Progress Notes (Signed)
On patient's arrival to clinic noted infusion pump beeping and that it had been stopped. Noted in programming that only 98 ml out of 150 ml had infused. Asked patient had he stopped pump for any reason. He replied no it was beeping because it was finished. Also noted that all 3 clamps on IV tubing were clamped off. Asked patient had he closed any of the clamps. He replied no, if they were closed he probably rolled over on it in his sleep. Informed MD of above. Patient had probable infusion 4 and 1/2 days out of 7 days. Patient denied any complaints/side effects from chemo.  Port de-accessed. Port prepped per protocol, re-accessed with Graycen Degan needle and Almir Botts dressing. Patient connected to infusion pump. Went over pump instructions again with patient and family. Verbalized understanding.Patient ambulatory on discharge with family.

## 2015-01-14 NOTE — Patient Instructions (Signed)
[facility] Discharge Instructions for Patients Receiving Chemotherapy  Today you received the following chemotherapy agents fluorouracil continuous infusion pump.  To help prevent nausea and vomiting after your treatment, we encourage you to take your nausea medication as instructed. If you develop nausea and vomiting that is not controlled by your nausea medication, call the clinic. If it is after clinic hours your family physician or the after hours number for the clinic or go to the Emergency Department. BELOW ARE SYMPTOMS THAT SHOULD BE REPORTED IMMEDIATELY:  *FEVER GREATER THAN 101.0 F  *CHILLS WITH OR WITHOUT FEVER  NAUSEA AND VOMITING THAT IS NOT CONTROLLED WITH YOUR NAUSEA MEDICATION  *UNUSUAL SHORTNESS OF BREATH  *UNUSUAL BRUISING OR BLEEDING  TENDERNESS IN MOUTH AND THROAT WITH OR WITHOUT PRESENCE OF ULCERS  *URINARY PROBLEMS  *BOWEL PROBLEMS  UNUSUAL RASH Items with * indicate a potential emergency and should be followed up as soon as possible.  Call clinic with any issues/concerns. Return as scheduled.  I have been informed and understand all the instructions given to me. I know to contact the clinic, my physician, or go to the Emergency Department if any problems should occur. I do not have any questions at this time, but understand that I may call the clinic during office hours or the Patient Navigator at [phone] should I have any questions or need assistance in obtaining follow up care.    __________________________________________  _____________  __________ Signature of Patient or Authorized Representative            Date                   Time    __________________________________________ Nurse's Signature

## 2015-01-15 ENCOUNTER — Encounter (HOSPITAL_COMMUNITY): Payer: Self-pay | Admitting: Hematology & Oncology

## 2015-01-17 ENCOUNTER — Telehealth (HOSPITAL_COMMUNITY): Payer: Self-pay | Admitting: Oncology
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
## 2015-01-21 ENCOUNTER — Encounter (HOSPITAL_BASED_OUTPATIENT_CLINIC_OR_DEPARTMENT_OTHER): Payer: Medicare Other | Admitting: Hematology & Oncology

## 2015-01-21 ENCOUNTER — Encounter (HOSPITAL_COMMUNITY): Payer: Medicare Other

## 2015-01-21 ENCOUNTER — Ambulatory Visit (HOSPITAL_COMMUNITY): Payer: Medicare Other | Admitting: Oncology

## 2015-01-21 DIAGNOSIS — R634 Abnormal weight loss: Secondary | ICD-10-CM

## 2015-01-21 DIAGNOSIS — C20 Malignant neoplasm of rectum: Secondary | ICD-10-CM

## 2015-01-21 DIAGNOSIS — R410 Disorientation, unspecified: Secondary | ICD-10-CM

## 2015-01-21 DIAGNOSIS — F0390 Unspecified dementia without behavioral disturbance: Secondary | ICD-10-CM | POA: Diagnosis not present

## 2015-01-21 LAB — CBC WITH DIFFERENTIAL/PLATELET
BASOS PCT: 0 % (ref 0–1)
Basophils Absolute: 0 10*3/uL (ref 0.0–0.1)
EOS ABS: 0.1 10*3/uL (ref 0.0–0.7)
EOS PCT: 2 % (ref 0–5)
HCT: 34.1 % — ABNORMAL LOW (ref 39.0–52.0)
Hemoglobin: 11 g/dL — ABNORMAL LOW (ref 13.0–17.0)
LYMPHS PCT: 13 % (ref 12–46)
Lymphs Abs: 0.6 10*3/uL — ABNORMAL LOW (ref 0.7–4.0)
MCH: 27.2 pg (ref 26.0–34.0)
MCHC: 32.3 g/dL (ref 30.0–36.0)
MCV: 84.4 fL (ref 78.0–100.0)
Monocytes Absolute: 0.6 10*3/uL (ref 0.1–1.0)
Monocytes Relative: 13 % — ABNORMAL HIGH (ref 3–12)
Neutro Abs: 3 10*3/uL (ref 1.7–7.7)
Neutrophils Relative %: 71 % (ref 43–77)
PLATELETS: 153 10*3/uL (ref 150–400)
RBC: 4.04 MIL/uL — AB (ref 4.22–5.81)
RDW: 16.6 % — ABNORMAL HIGH (ref 11.5–15.5)
WBC: 4.3 10*3/uL (ref 4.0–10.5)

## 2015-01-21 LAB — COMPREHENSIVE METABOLIC PANEL
ALT: 15 U/L — ABNORMAL LOW (ref 17–63)
AST: 20 U/L (ref 15–41)
Albumin: 3 g/dL — ABNORMAL LOW (ref 3.5–5.0)
Alkaline Phosphatase: 62 U/L (ref 38–126)
Anion gap: 5 (ref 5–15)
BUN: 14 mg/dL (ref 6–20)
CHLORIDE: 107 mmol/L (ref 101–111)
CO2: 28 mmol/L (ref 22–32)
CREATININE: 0.82 mg/dL (ref 0.61–1.24)
Calcium: 8.3 mg/dL — ABNORMAL LOW (ref 8.9–10.3)
GFR calc Af Amer: >60 mL/min (ref >60)
GFR calc non Af Amer: >60 mL/min (ref >60)
Glucose, Bld: 113 mg/dL — ABNORMAL HIGH (ref 65–99)
Potassium: 3.8 mmol/L (ref 3.5–5.1)
SODIUM: 140 mmol/L (ref 135–145)
Total Bilirubin: 0.4 mg/dL (ref 0.3–1.2)
Total Protein: 5.7 g/dL — ABNORMAL LOW (ref 6.5–8.1)

## 2015-01-21 MED ORDER — HEPARIN SOD (PORK) LOCK FLUSH 100 UNIT/ML IV SOLN
500.0000 [IU] | Freq: Once | INTRAVENOUS | Status: AC
  Administered 2015-01-21: 500 [IU] via INTRAVENOUS

## 2015-01-21 MED ORDER — HEPARIN SOD (PORK) LOCK FLUSH 100 UNIT/ML IV SOLN
INTRAVENOUS | Status: AC
Start: 2015-01-21 — End: 2015-01-21
  Filled 2015-01-21: qty 5

## 2015-01-21 MED ORDER — SODIUM CHLORIDE 0.9 % IJ SOLN
10.0000 mL | INTRAMUSCULAR | Status: DC | PRN
  Administered 2015-01-21: 10 mL via INTRAVENOUS
  Filled 2015-01-21: qty 10

## 2015-01-21 NOTE — Progress Notes (Signed)
Janan Halter presented for Portacath de-access and flush.  Proper placement of portacath confirmed by CXR.  Portacath located right chest wall accessed with  H 20 needle.  Good blood return present. Portacath flushed with 21ml NS and 500U/76ml Heparin and needle removed intact.  Procedure tolerated well and without incident. Blood drawn, pump removed   Held infusional 67fu til next week

## 2015-01-21 NOTE — Patient Instructions (Signed)
[place] at [facility] Discharge Instructions  RECOMMENDATIONS MADE BY THE CONSULTANT AND ANY TEST RESULTS WILL BE SENT TO YOUR REFERRING PHYSICIAN.  No chemo today. Return to clinic in 1 week for follow-up with MD and possibly re-initiate chemo infusion pump. Return as scheduled.  Thank you for choosing [place] at [facility] to provide your oncology and hematology care.  To afford each patient quality time with our provider, please arrive at least 15 minutes before your scheduled appointment time.    You need to re-schedule your appointment should you arrive 10 or more minutes late.  We strive to give you quality time with our providers, and arriving late affects you and other patients whose appointments are after yours.  Also, if you no show three or more times for appointments you may be dismissed from the clinic at the providers discretion.     Again, thank you for choosing [facility].  Our hope is that these requests will decrease the amount of time that you wait before being seen by our physicians.       _____________________________________________________________  Should you have questions after your visit to [facility], please contact our office at [phone] between the hours of 8:30 a.m. and 4:30 p.m.  Voicemails left after 4:30 p.m. will not be returned until the following business day.  For prescription refill requests, have your pharmacy contact our office.

## 2015-01-21 NOTE — Progress Notes (Signed)
[place] at [facility] Progress Note  Patient Care Team: Brad Donna, MD as PCP - General (Family Medicine)  CHIEF COMPLAINTS/PURPOSE OF CONSULTATION:  11/13/2014 Colonoscopy on 11/13/2014 with large distal rectal mass with noncritical luminal narrowing (Dr. Laural Sanchez) Biopsy c/w adenocarcinoma  CT C/A/P on 11/13/2014 with bulky annular soft tissue mass involving the rectum measuring approximately 5.6 x 5.5 x 6.5 cm, c/w newly diagnosed rectal carcinoma. No evidence of metastatic disease  Flexible sigmoidoscopy EUS with Dr. Ardis Sanchez showing a large nearly circumferential friable mass in the rectum. The masses 5 cm across, distal edge about 1 cm from the anal verge. Lesion clearly passes into and through the muscularis propria layer of the rectal wall, 1.9 cm in depth uT3, 1 small but suspicious perirectal lymph node (uT3N1) Stage IIIb  Neoadjuvant CI 5-FU and XRT  HISTORY OF PRESENTING ILLNESS:  Brad Sanchez 80 y.o. male is here for follow-up of stage IIIB adenocarcinoma of the rectum. Last week he did not receive his final infusion of 5-FU as his pump malfunctioned, however the patient did not notify us. His son notes today that the patient has been very confused over the last several days. He apparently has a lot of skin changes from radiation and Friday did not go to radiation. They did not know where he was all day. Sunday he reportedly took a nap,  workup and went to radiation therapy.  Patient is here today with his son.  When the patient was told he could use a "break from treatment," he clapped his hands in excitement.  He says his radiation treatment burns sometimes, and he is using a cream to treat this (Biafine?)  Son reports that they have not seen a physician any time he's been over there receiving treatment.  Says he is sleeping like a baby at night, and reports that his bowel movements are good. Soreness "in his rear end is bearable," he says.    Patient seems like he has distaste for the sits baths and refuses to treat himself with them. The son prepares his father's sits baths occasionally.  He reports that he is not having any diarrhea. His son disagrees. He is very difficult to get an accurate history from Brad Sanchez today.  MEDICAL HISTORY:  Past Medical History  Diagnosis Date  . High cholesterol   . Anxiety   . Heart murmur     Dr. Emilee Sanchez told him 15 years ago  . Headache   . Cancer     rectal    SURGICAL HISTORY: Past Surgical History  Procedure Laterality Date  . Colonoscopy N/A 11/13/2014    Procedure: COLONOSCOPY;  Surgeon: Brad Houston, MD;  [facility];  Service: Endoscopy;  Laterality: N/A;  730  . Eus N/A 12/12/2014    Procedure: LOWER ENDOSCOPIC ULTRASOUND (EUS);  Surgeon: Brad Banister, MD;  Location: [facility];  Service: Endoscopy;  Laterality: N/A;    SOCIAL HISTORY: Social History   Social History  . Marital Status: Widowed    Spouse Name: N/A  . Number of Children: N/A  . Years of Education: N/A   Occupational History  . retired    Social History Main Topics  . Smoking status: Light Tobacco Smoker    Types: Cigars  . Smokeless tobacco: Not on file  . Alcohol Use: No  . Drug Use: No  . Sexual Activity: Not on file   Other Topics Concern  . Not on file  Social History Narrative  He is widowed, his wife was taken care of at [place] for 15 yrs.  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 indicated that his mother is deceased. He reported the following about his father: unknown.  Mother deceased, age 36, breast cancer   Only child  ALLERGIES:  has No Known Allergies.  MEDICATIONS:  Current Outpatient Prescriptions  Medication Sig Dispense Refill  . acetaminophen (TYLENOL) 325 MG tablet  Take 650 mg by mouth every 6 (six) hours as needed for moderate pain.     Marland Kitchen lidocaine-prilocaine (EMLA) cream APPLY QUARTER SIZE TO PORT SITE ONE HOUR PRIOR TO CHEMO. DO NOT RUB IN. COVER WITH PLASTIC WRAP. 30 g 6  . Loperamide HCl ([employer] ANTI-DIARRHEA PO) Take 1 tablet by mouth daily as needed (diarrhea).    . LORazepam (ATIVAN) 1 MG tablet Take 1 mg by mouth daily as needed for anxiety or sleep (Take at bedtime).     . ondansetron (ZOFRAN) 8 MG tablet Take 1 tablet (8 mg total) by mouth every 8 (eight) hours as needed for nausea or vomiting. 30 tablet 1  . rosuvastatin (CRESTOR) 20 MG tablet Take 20 mg by mouth daily.      . Sennosides (EX-LAX MAXIMUM STRENGTH) 25 MG TABS Take 1 tablet by mouth daily as needed (constipation).     No current facility-administered medications for this visit.    Review of Systems  Constitutional: Negative.  Mild confusion at times. Cardiovascular: Negative Gastrointestinal: Negative Skin: Negative  Neurological: Negative.  All other systems reviewed and are negative.  14 point ROS was done and is otherwise as detailed above or in HPI   PHYSICAL EXAMINATION: ECOG PERFORMANCE STATUS: 1 - Symptomatic but completely ambulatory  Filed Vitals:   01/21/15 0951  BP: 125/53  Pulse: 77  Temp: 98 F (36.7 C)  Resp: 18   Filed Weights   01/21/15 0951  Weight: 137 lb 12.8 oz (62.506 kg)    Physical Exam  Constitutional: He is oriented to person, place, and time and well-developed, well-nourished, and in no distress.  Thin HENT:  Head: Normocephalic and atraumatic.  Nose: Nose normal.  Mouth/Throat: Oropharynx is clear and moist. No oropharyngeal exudate.  Eyes: Conjunctivae and EOM are normal. Pupils are equal, round, and reactive to light. Right eye exhibits no discharge. Left eye exhibits no discharge. No scleral icterus.  Neck: Normal range of motion. Neck supple. No tracheal deviation present. No thyromegaly present.  Cardiovascular: Normal rate  and regular rhythm.  Exam reveals no gallop and no friction rub.   Murmur heard. Systolic ejection murmur  Pulmonary/Chest: Effort normal and breath sounds normal. He has no wheezes. He has no rales.  Abdominal: Soft. Bowel sounds are normal. He exhibits no distension and no mass. There is no tenderness. There is no rebound and no guarding.  Musculoskeletal: Normal range of motion. He exhibits no edema.  Lymphadenopathy:    He has no cervical adenopathy.  Neurological: He is alert and oriented to person, place, and time. He has normal reflexes. No cranial nerve deficit. Gait normal. Coordination normal.  Skin: Skin is warm and dry. No rash noted.  Psychiatric: Mood, memory, affect and judgment normal.  Nursing note and vitals reviewed.    LABORATORY DATA:  I have reviewed the data as  listed Lab Results  Component Value Date   WBC 4.3 01/21/2015   HGB 11.0* 01/21/2015   HCT 34.1* 01/21/2015   MCV 84.4 01/21/2015   PLT 153 01/21/2015   CMP     Component Value Date/Time   NA 140 01/21/2015 1350   K 3.8 01/21/2015 1350   CL 107 01/21/2015 1350   CO2 28 01/21/2015 1350   GLUCOSE 113* 01/21/2015 1350   BUN 14 01/21/2015 1350   CREATININE 0.82 01/21/2015 1350   CALCIUM 8.3* 01/21/2015 1350   PROT 5.7* 01/21/2015 1350   ALBUMIN 3.0* 01/21/2015 1350   AST 20 01/21/2015 1350   ALT 15* 01/21/2015 1350   ALKPHOS 62 01/21/2015 1350   BILITOT 0.4 01/21/2015 1350   GFRNONAA >60 01/21/2015 1350   GFRAA >60 01/21/2015 1350      Results for VENKAT, ANKNEY (MRN [id number]) as of 11/28/2014 12:00  Ref. Range 11/13/2014 09:55  CEA Latest Ref Range: 0.0-4.7 ng/mL 0.9   RADIOGRAPHIC STUDIES: I have personally reviewed the radiological images as listed and agreed with the findings in the report.  CLINICAL DATA: Newly diagnosed rectal carcinoma by colonoscopy. Rectal bleeding. 15 lb weight loss over past several months.  EXAM: CT CHEST, ABDOMEN, AND PELVIS WITH  CONTRAST  TECHNIQUE: Multidetector CT imaging of the chest, abdomen and pelvis was performed following the standard protocol during bolus administration of intravenous contrast.  CONTRAST: 175mL OMNIPAQUE IOHEXOL 300 MG/ML SOLN  COMPARISON: None.  FINDINGS: CT CHEST FINDINGS  IMPRESSION: Bulky annular soft tissue mass involving the rectum measuring approximately 5.6 x 5.5 x 6.5 cm, consistent with newly diagnosed rectal carcinoma.  No evidence of pelvic lymphadenopathy or distant metastatic disease.  Sigmoid diverticulosis. No radiographic evidence of diverticulitis.   Electronically Signed  By: Earle Gell M.D.  On: 11/13/2014 13:07    ASSESSMENT & PLAN:  Rectal Adenocarcinoma CT C/A/P on 11/13/2014 without distant metastatic disease uT3 N1 disease, Stage IIIB Mild Dementia Weight loss Neoadjuvant XRT/infusional 5-Fu to start 12/24/2015  I recommended holding his 5-FU this week. Unfortunately I think the patient is having increased confusion from some of his treatment induced side effects. He does have baseline dementia. We will call over to radiation and see if they can examine his skin to make sure his XRT induced changes are not excessive. The patient will not let me examine him.  We will plan on a return on one week. If he is improved. We will restart his 5-FU. I have re-emphasized to the patient and family the importance of notifying us of any problems or difficulties he is having. I advised his son to notify us of issues he has to the weekend on Monday mornings.  All questions were answered. The patient knows to call the clinic with any problems, questions or concerns.   This note was electronically signed.    This document serves as a record of services personally performed by Ancil Linsey, MD. It was created on her behalf by Toni Amend, a trained medical scribe. The creation of this record is based on the scribe's personal observations and  the provider's statements to them. This document has been checked and approved by the attending provider.  I have reviewed the above documentation for accuracy and completeness, and I agree with the above.  Kelby Fam. Whitney Muse, MD

## 2015-01-22 ENCOUNTER — Encounter (HOSPITAL_COMMUNITY): Payer: Self-pay | Admitting: Hematology & Oncology

## 2015-01-28 ENCOUNTER — Encounter (HOSPITAL_BASED_OUTPATIENT_CLINIC_OR_DEPARTMENT_OTHER): Payer: Medicare Other

## 2015-01-28 ENCOUNTER — Encounter (HOSPITAL_COMMUNITY): Payer: Self-pay | Admitting: Hematology & Oncology

## 2015-01-28 ENCOUNTER — Encounter (HOSPITAL_BASED_OUTPATIENT_CLINIC_OR_DEPARTMENT_OTHER): Payer: Medicare Other | Admitting: Hematology & Oncology

## 2015-01-28 VITALS — BP 113/57 | HR 77 | Temp 97.7°F | Resp 20 | Wt 137.5 lb

## 2015-01-28 DIAGNOSIS — F0390 Unspecified dementia without behavioral disturbance: Secondary | ICD-10-CM

## 2015-01-28 DIAGNOSIS — C20 Malignant neoplasm of rectum: Secondary | ICD-10-CM | POA: Diagnosis not present

## 2015-01-28 DIAGNOSIS — Z5111 Encounter for antineoplastic chemotherapy: Secondary | ICD-10-CM

## 2015-01-28 DIAGNOSIS — R634 Abnormal weight loss: Secondary | ICD-10-CM | POA: Diagnosis not present

## 2015-01-28 LAB — CBC WITH DIFFERENTIAL/PLATELET
BASOS ABS: 0 10*3/uL (ref 0.0–0.1)
Basophils Relative: 0 % (ref 0–1)
EOS ABS: 0.1 10*3/uL (ref 0.0–0.7)
EOS PCT: 3 % (ref 0–5)
HCT: 33.7 % — ABNORMAL LOW (ref 39.0–52.0)
HEMOGLOBIN: 11.1 g/dL — AB (ref 13.0–17.0)
LYMPHS ABS: 0.7 10*3/uL (ref 0.7–4.0)
LYMPHS PCT: 16 % (ref 12–46)
MCH: 28 pg (ref 26.0–34.0)
MCHC: 32.9 g/dL (ref 30.0–36.0)
MCV: 84.9 fL (ref 78.0–100.0)
Monocytes Absolute: 0.5 10*3/uL (ref 0.1–1.0)
Monocytes Relative: 12 % (ref 3–12)
NEUTROS PCT: 69 % (ref 43–77)
Neutro Abs: 3.2 10*3/uL (ref 1.7–7.7)
PLATELETS: 164 10*3/uL (ref 150–400)
RBC: 3.97 MIL/uL — AB (ref 4.22–5.81)
RDW: 17.3 % — ABNORMAL HIGH (ref 11.5–15.5)
WBC: 4.6 10*3/uL (ref 4.0–10.5)

## 2015-01-28 LAB — COMPREHENSIVE METABOLIC PANEL
ALK PHOS: 55 U/L (ref 38–126)
ALT: 15 U/L — AB (ref 17–63)
AST: 19 U/L (ref 15–41)
Albumin: 3.1 g/dL — ABNORMAL LOW (ref 3.5–5.0)
Anion gap: 6 (ref 5–15)
BUN: 20 mg/dL (ref 6–20)
CALCIUM: 8.4 mg/dL — AB (ref 8.9–10.3)
CHLORIDE: 105 mmol/L (ref 101–111)
CO2: 26 mmol/L (ref 22–32)
CREATININE: 0.73 mg/dL (ref 0.61–1.24)
GFR calc Af Amer: >60 mL/min (ref >60)
GFR calc non Af Amer: >60 mL/min (ref >60)
Glucose, Bld: 98 mg/dL (ref 65–99)
Potassium: 4.2 mmol/L (ref 3.5–5.1)
SODIUM: 137 mmol/L (ref 135–145)
Total Bilirubin: 0.4 mg/dL (ref 0.3–1.2)
Total Protein: 5.9 g/dL — ABNORMAL LOW (ref 6.5–8.1)

## 2015-01-28 MED ORDER — SODIUM CHLORIDE 0.9 % IV SOLN
225.0000 mg/m2/d | INTRAVENOUS | Status: DC
  Administered 2015-01-28: 1550 mg via INTRAVENOUS
  Filled 2015-01-28: qty 10

## 2015-01-28 MED ORDER — SODIUM CHLORIDE 0.9 % IJ SOLN: 10.0000 mL | INTRAMUSCULAR | Status: DC | PRN

## 2015-01-28 NOTE — Progress Notes (Signed)
Janan Halter Connected to 5FU pump, discharged ambulatory

## 2015-01-28 NOTE — Patient Instructions (Signed)
[facility] Discharge Instructions for Patients Receiving Chemotherapy  Today you received the following chemotherapy agents infusional 85fu Return Friday at 12 to have your pump removed Please call the clinic if you have any questions or concerns  To help prevent nausea and vomiting after your treatment, we encourage you to take your nausea medication    If you develop nausea and vomiting, or diarrhea that is not controlled by your medication, call the clinic.  The clinic phone number is [phone]. Office hours are Monday-Friday 8:30am-5:00pm.  BELOW ARE SYMPTOMS THAT SHOULD BE REPORTED IMMEDIATELY:  *FEVER GREATER THAN 101.0 F  *CHILLS WITH OR WITHOUT FEVER  NAUSEA AND VOMITING THAT IS NOT CONTROLLED WITH YOUR NAUSEA MEDICATION  *UNUSUAL SHORTNESS OF BREATH  *UNUSUAL BRUISING OR BLEEDING  TENDERNESS IN MOUTH AND THROAT WITH OR WITHOUT PRESENCE OF ULCERS  *URINARY PROBLEMS  *BOWEL PROBLEMS  UNUSUAL RASH Items with * indicate a potential emergency and should be followed up as soon as possible. If you have an emergency after office hours please contact your primary care physician or go to the nearest emergency department.  Please call the clinic during office hours if you have any questions or concerns.   You may also contact the Patient Navigator at [phone] should you have any questions or need assistance in obtaining follow up care. _____________________________________________________________________ Have you asked about our STAR program?    STAR stands for Survivorship Training and Rehabilitation, and this is a nationally recognized cancer care program that focuses on survivorship and rehabilitation.  Cancer and cancer treatments may cause problems, such as, pain, making you feel tired and keeping you from doing the things that you need or want to do. Cancer rehabilitation can help. Our goal is to reduce these troubling effects and help you have the  best quality of life possible.  You may receive a survey from a nurse that asks questions about your current state of health.  Based on the survey results, all eligible patients will be referred to the [facility] program for an evaluation so we can better serve you! A frequently asked questions sheet is available upon request.

## 2015-01-28 NOTE — Progress Notes (Signed)
[place] at [facility] Progress Note  Patient Care Team: Marjean Donna, MD as PCP - General (Family Medicine)  CHIEF COMPLAINTS/PURPOSE OF CONSULTATION:  11/13/2014 Colonoscopy on 11/13/2014 with large distal rectal mass with noncritical luminal narrowing (Dr. Laural Golden) Biopsy c/w adenocarcinoma  CT C/A/P on 11/13/2014 with bulky annular soft tissue mass involving the rectum measuring approximately 5.6 x 5.5 x 6.5 cm, c/w newly diagnosed rectal carcinoma. No evidence of metastatic disease  Flexible sigmoidoscopy EUS with Dr. Ardis Hughs showing a large nearly circumferential friable mass in the rectum. The masses 5 cm across, distal edge about 1 cm from the anal verge. Lesion clearly passes into and through the muscularis propria layer of the rectal wall, 1.9 cm in depth uT3, 1 small but suspicious perirectal lymph node (uT3N1) Stage IIIb  Neoadjuvant CI 5-FU and XRT  HISTORY OF PRESENTING ILLNESS:  Brad Sanchez 80 y.o. male is here for follow-up of stage IIIB adenocarcinoma of the rectum. It has been challenging to treat him secondary to his dementia.  He continues to live independently but is not forthcoming with symptoms.  One week his 5-FU pump malfunctioned and he simply shut it off.   The patient is present with his son today and says that he is doing better.  He is eating better and says that food tastes better.  He is not favorable of having his pump put back on.  The patient denies any mouth sores and says that he uses cream "on his butt."  He admits to occasional diarrhea but says he is trying to "clean himself better."  He completes XRT on Friday.  MEDICAL HISTORY:  Past Medical History  Diagnosis Date  . High cholesterol   . Anxiety   . Heart murmur     Dr. Emilee Hero told him 15 years ago  . Headache   . Cancer     rectal    SURGICAL HISTORY: Past Surgical History  Procedure Laterality Date  . Colonoscopy N/A 11/13/2014    Procedure: COLONOSCOPY;   Surgeon: Rogene Houston, MD;  [facility];  Service: Endoscopy;  Laterality: N/A;  730  . Eus N/A 12/12/2014    Procedure: LOWER ENDOSCOPIC ULTRASOUND (EUS);  Surgeon: Milus Banister, MD;  Location: [facility];  Service: Endoscopy;  Laterality: N/A;    SOCIAL HISTORY: Social History   Social History  . Marital Status: Widowed    Spouse Name: N/A  . Number of Children: N/A  . Years of Education: N/A   Occupational History  . retired    Social History Main Topics  . Smoking status: Light Tobacco Smoker    Types: Cigars  . Smokeless tobacco: Not on file  . Alcohol Use: No  . Drug Use: No  . Sexual Activity: Not on file   Other Topics Concern  . Not on file   Social History Narrative  He is widowed, his wife was taken care of at [place] for 15 yrs.  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 No pertinent family history. indicated that his mother is deceased. He reported the following about his father: unknown.  Mother deceased, age 83, breast cancer   Only child  ALLERGIES:  has No Known Allergies.  MEDICATIONS:  Current Outpatient Prescriptions  Medication Sig Dispense Refill  . acetaminophen (  TYLENOL) 325 MG tablet Take 650 mg by mouth every 6 (six) hours as needed for moderate pain.     Marland Kitchen lidocaine-prilocaine (EMLA) cream APPLY QUARTER SIZE TO PORT SITE ONE HOUR PRIOR TO CHEMO. DO NOT RUB IN. COVER WITH PLASTIC WRAP. 30 g 6  . Loperamide HCl ([employer] ANTI-DIARRHEA PO) Take 1 tablet by mouth daily as needed (diarrhea).    . LORazepam (ATIVAN) 1 MG tablet Take 1 mg by mouth daily as needed for anxiety or sleep (Take at bedtime).     . ondansetron (ZOFRAN) 8 MG tablet Take 1 tablet (8 mg total) by mouth every 8 (eight) hours as needed for nausea or vomiting. 30 tablet 1  . rosuvastatin (CRESTOR) 20  MG tablet Take 20 mg by mouth daily.      . Sennosides (EX-LAX MAXIMUM STRENGTH) 25 MG TABS Take 1 tablet by mouth daily as needed (constipation).     No current facility-administered medications for this visit.    Review of Systems  Constitutional: Negative.  Mild confusion at times. Cardiovascular: Negative Gastrointestinal: Diarrhea, XRT skin changes, "mild pain" Skin: Negative  Neurological: Negative.  All other systems reviewed and are negative.  14 point ROS was done and is otherwise as detailed above or in HPI    PHYSICAL EXAMINATION: ECOG PERFORMANCE STATUS: 1 - Symptomatic but completely ambulatory  Filed Vitals:   01/28/15 0900  BP: 113/57  Pulse: 77  Temp: 97.7 F (36.5 C)  Resp: 20   Filed Weights   01/28/15 0900  Weight: 137 lb 8 oz (62.37 kg)    Physical Exam  Constitutional: He is oriented to person, place, and time and well-developed, well-nourished, and in no distress.  Pleasant Thin HENT:  Head: Normocephalic and atraumatic.  Nose: Nose normal.  Mouth/Throat: Oropharynx is clear and moist. No oropharyngeal exudate.  Eyes: Conjunctivae and EOM are normal. Pupils are equal, round, and reactive to light. Right eye exhibits no discharge. Left eye exhibits no discharge. No scleral icterus.  Neck: Normal range of motion. Neck supple. No tracheal deviation present. No thyromegaly present.  Cardiovascular: Normal rate and regular rhythm.  Exam reveals no gallop and no friction rub.   Murmur heard. Systolic ejection murmur  Pulmonary/Chest: Effort normal and breath sounds normal. He has no wheezes. He has no rales.  Abdominal: Soft. Bowel sounds are normal. He exhibits no distension and no mass. There is no tenderness. There is no rebound and no guarding.  Musculoskeletal: Normal range of motion. He exhibits no edema.  Lymphadenopathy:    He has no cervical adenopathy.  Neurological: He is alert and oriented to person, place, and time. He has normal  reflexes. No cranial nerve deficit. Gait normal. Coordination normal.  Skin: Skin is warm and dry. No rash noted.  Psychiatric: Mood and memory are ok. At baseline Nursing note and vitals reviewed.    LABORATORY DATA:  I have reviewed the data as listed Lab Results  Component Value Date   WBC 4.6 01/28/2015   HGB 11.1* 01/28/2015   HCT 33.7* 01/28/2015   MCV 84.9 01/28/2015   PLT 164 01/28/2015   CMP     Component Value Date/Time   NA 137 01/28/2015 1130   K 4.2 01/28/2015 1130   CL 105 01/28/2015 1130   CO2 26 01/28/2015 1130   GLUCOSE 98 01/28/2015 1130   BUN 20 01/28/2015 1130   CREATININE 0.73 01/28/2015 1130   CALCIUM 8.4* 01/28/2015 1130   PROT 5.9* 01/28/2015 1130  ALBUMIN 3.1* 01/28/2015 1130   AST 19 01/28/2015 1130   ALT 15* 01/28/2015 1130   ALKPHOS 55 01/28/2015 1130   BILITOT 0.4 01/28/2015 1130   GFRNONAA >60 01/28/2015 1130   GFRAA >60 01/28/2015 1130      Results for DACARI, BECKSTRAND (MRN [id number]) as of 11/28/2014 12:00  Ref. Range 11/13/2014 09:55  CEA Latest Ref Range: 0.0-4.7 ng/mL 0.9    ASSESSMENT & PLAN:  Rectal Adenocarcinoma CT C/A/P on 11/13/2014 without distant metastatic disease uT3 N1 disease, Stage IIIB Mild Dementia Weight loss Neoadjuvant XRT/infusional 5-Fu to start 12/24/2015  He has missed 2 weeks of infusional 5-FU, one secondary to a pump malfunction and the patient did not notify us. The second week I held his therapy secondary to worsening confusion. He has not been forthcoming about side effects or symptoms he has had. He finished his radiation on Friday. We are going to place the 5-FU pump today and he will come in Friday to have it removed. Range of the patient's son that I feel we have done the best we could with his neo-adjuvant treatment.  I will plan on seeing him back again in the next week or 2 at which time we will make sure that all of his follow-up appointments are made.  All questions were answered. The  patient knows to call the clinic with any problems, questions or concerns.   This note was electronically signed.    This document serves as a record of services personally performed by Ancil Linsey, MD. It was created on her behalf by Janace Hoard, a trained medical scribe. The creation of this record is based on the scribe's personal observations and the provider's statements to them. This document has been checked and approved by the attending provider.  I have reviewed the above documentation for accuracy and completeness, and I agree with the above.  Kelby Fam. Whitney Muse, MD

## 2015-01-29 ENCOUNTER — Encounter (HOSPITAL_COMMUNITY): Payer: Self-pay | Admitting: Hematology & Oncology

## 2015-01-30 MED ORDER — HEPARIN SOD (PORK) LOCK FLUSH 100 UNIT/ML IV SOLN
INTRAVENOUS | Status: AC
  Filled 2015-01-30: qty 5

## 2015-01-31 ENCOUNTER — Encounter (HOSPITAL_BASED_OUTPATIENT_CLINIC_OR_DEPARTMENT_OTHER): Payer: Medicare Other

## 2015-01-31 DIAGNOSIS — C20 Malignant neoplasm of rectum: Secondary | ICD-10-CM

## 2015-01-31 DIAGNOSIS — Z452 Encounter for adjustment and management of vascular access device: Secondary | ICD-10-CM

## 2015-01-31 MED ORDER — SODIUM CHLORIDE 0.9 % IJ SOLN
10.0000 mL | INTRAMUSCULAR | Status: DC | PRN
  Administered 2015-01-31: 10 mL
  Filled 2015-01-31: qty 10

## 2015-01-31 MED ORDER — HEPARIN SOD (PORK) LOCK FLUSH 100 UNIT/ML IV SOLN
500.0000 [IU] | Freq: Once | INTRAVENOUS | Status: AC | PRN
  Administered 2015-01-31: 500 [IU]

## 2015-01-31 NOTE — Progress Notes (Signed)
Discontinued continuous infusion pump. Flushed port per protocol. Patient denies any complaints. Reports he completed radiation today.

## 2015-02-04 ENCOUNTER — Other Ambulatory Visit (HOSPITAL_COMMUNITY): Payer: Medicare Other

## 2015-02-13 ENCOUNTER — Encounter (HOSPITAL_COMMUNITY): Payer: Medicare Other | Attending: Hematology & Oncology | Admitting: Oncology

## 2015-02-13 ENCOUNTER — Encounter (HOSPITAL_COMMUNITY): Payer: Self-pay | Admitting: Oncology

## 2015-02-13 VITALS — BP 118/68 | HR 87 | Temp 98.0°F | Resp 20 | Wt 139.5 lb

## 2015-02-13 DIAGNOSIS — C20 Malignant neoplasm of rectum: Secondary | ICD-10-CM | POA: Insufficient documentation

## 2015-02-13 NOTE — Patient Instructions (Addendum)
[place] at [facility] Discharge Instructions  RECOMMENDATIONS MADE BY THE CONSULTANT AND ANY TEST RESULTS WILL BE SENT TO YOUR REFERRING PHYSICIAN.  Exam done and seen by Kirby Crigler today Return in 4 weeks  Thank you for choosing [place] at [facility] to provide your oncology and hematology care.  To afford each patient quality time with our provider, please arrive at least 15 minutes before your scheduled appointment time.    You need to re-schedule your appointment should you arrive 10 or more minutes late.  We strive to give you quality time with our providers, and arriving late affects you and other patients whose appointments are after yours.  Also, if you no show three or more times for appointments you may be dismissed from the clinic at the providers discretion.     Again, thank you for choosing [facility].  Our hope is that these requests will decrease the amount of time that you wait before being seen by our physicians.       _____________________________________________________________  Should you have questions after your visit to [facility], please contact our office at [phone] between the hours of 8:30 a.m. and 4:30 p.m.  Voicemails left after 4:30 p.m. will not be returned until the following business day.  For prescription refill requests, have your pharmacy contact our office.

## 2015-02-13 NOTE — Assessment & Plan Note (Signed)
Stage IIIB Rectal Cancer (T3N1a) S/Pconcomitant chemoradiation with 5FU CI.  Discussion regarding surgical resection.  Patient and family agreeable.  Refer to Dr. Marcello Moores ([facility]) for surgical resection made.  Case discussed with Dr. Marcello Moores via [facility].  Following surgical resection, we can consider adjuvant therapy, such as Xeloda single agent, but not pursing adjuvant chemotherapy may be very reasonable as well.  We will discuss further in the future.  Return in 3-4 weeks for follow-up.

## 2015-02-13 NOTE — Progress Notes (Signed)
Brad Hampshire, MD [place] [address]  Rectal cancer  CURRENT THERAPY: S/P 5FU infusional infusion with XRT in neoadjuvant setting.  INTERVAL HISTORY: Brad Sanchez 80 y.o. male returns for followup of Stage IIIB rectal adenocarcinoma undergoing neoadjuvant XRT/infusional 5-FU beginning on 12/24/2014.    Rectal cancer   11/13/2014 Imaging CT CAP- Bulky annular soft tissue mass involving the rectum measuring approximately 5.6 x 5.5 x 6.5 cm, consistent with newly diagnosed rectal carcinoma. No evidence of pelvic lymphadenopathy or distant metastatic disease.   11/13/2014 Initial Diagnosis Diagnosis Rectum, biopsy, rectal mass - ADENOCARCINOMA.   11/13/2014 Tumor Marker CEA 0.9   12/12/2014 Imaging EUS- 5cm, nearly circumferential uT3N1 (stage IIIb) rectal adenocarcinoma with distal edge located 1cm from internal anal verge   12/24/2014 - 01/31/2015 Chemotherapy 5-FU   12/24/2014 - 01/31/2015 Radiation Therapy     Brad Sanchez has completed XRT and systemic chemotherapy.  He finished about 1.5 weeks ago.  I discussed future treatment options which includes surgical resection.  The patient questions whether he needs surgery or not so I spent time discussion the role of surgical resection.  It is strongly recommended to pursue surgery.  Adjuvant therapy following surgery is questionable given his tolerance to neoadjuvant therapy, co-morbidities, and dementia.  Other option is to stop all therapy knowing that progression of disease will occur and cause issues moving forward.  Family who accompanied him today understand.  The patient is agreeable to surgery, but is not sure he is interested in adjuvant therapy which may be very reasonable.  Past Medical History  Diagnosis Date  . High cholesterol   . Anxiety   . Heart murmur     Dr. Emilee Hero told him 15 years ago  . Headache   . Cancer     rectal    has Rotator cuff syndrome of right shoulder and Rectal  cancer on his problem list.     has No Known Allergies.  Current Outpatient Prescriptions on File Prior to Visit  Medication Sig Dispense Refill  . acetaminophen (TYLENOL) 325 MG tablet Take 650 mg by mouth every 6 (six) hours as needed for moderate pain.     Marland Kitchen lidocaine-prilocaine (EMLA) cream APPLY QUARTER SIZE TO PORT SITE ONE HOUR PRIOR TO CHEMO. DO NOT RUB IN. COVER WITH PLASTIC WRAP. 30 g 6  . Loperamide HCl ([employer] ANTI-DIARRHEA PO) Take 1 tablet by mouth daily as needed (diarrhea).    . LORazepam (ATIVAN) 1 MG tablet Take 1 mg by mouth daily as needed for anxiety or sleep (Take at bedtime).     . ondansetron (ZOFRAN) 8 MG tablet Take 1 tablet (8 mg total) by mouth every 8 (eight) hours as needed for nausea or vomiting. 30 tablet 1  . rosuvastatin (CRESTOR) 20 MG tablet Take 20 mg by mouth daily.      . Sennosides (EX-LAX MAXIMUM STRENGTH) 25 MG TABS Take 1 tablet by mouth daily as needed (constipation).     No current facility-administered medications on file prior to visit.    Past Surgical History  Procedure Laterality Date  . Colonoscopy N/A 11/13/2014    Procedure: COLONOSCOPY;  Surgeon: Rogene Houston, MD;  Location[facility];  Service: Endoscopy;  Laterality: N/A;  730  . Eus N/A 12/12/2014    Procedure: LOWER ENDOSCOPIC ULTRASOUND (EUS);  Surgeon: Milus Banister, MD;  Location: [facility];  Service: Endoscopy;  Laterality: N/A;    Denies any  headaches, double vision, fevers, chills, night sweats, nausea, vomiting, diarrhea, constipation, chest pain, heart palpitations, shortness of breath, urinary pain, urinary burning, urinary frequency, hematuria.   PHYSICAL EXAMINATION  ECOG PERFORMANCE STATUS: 1 - Symptomatic but completely ambulatory  Filed Vitals:   02/13/15 1143  BP: 118/68  Pulse: 87  Temp: 98 F (36.7 C)  Resp: 20    GENERAL:alert, no distress, well developed, comfortable, cooperative, smiling and accompanied by his two sons, odor of feces is  noted. SKIN: skin color, texture, turgor are normal, no rashes or significant lesions HEAD: Normocephalic, No masses, lesions, tenderness or abnormalities EYES: normal, PERRLA, EOMI, Conjunctiva are pink and non-injected EARS: External ears normal OROPHARYNX:lips, buccal mucosa, and tongue normal and mucous membranes are moist  NECK: supple, no adenopathy, trachea midline LYMPH:  no palpable lymphadenopathy BREAST:not examined LUNGS: clear to auscultation  HEART: regular rate & rhythm ABDOMEN:abdomen soft and normal bowel sounds BACK: Back symmetric, no curvature. EXTREMITIES:less then 2 second capillary refill, no joint deformities, effusion, or inflammation, no skin discoloration, no cyanosis  NEURO: alert with fluent speech, no focal deficits noted.   LABORATORY DATA: CBC    Component Value Date/Time   WBC 4.6 01/28/2015 1130   RBC 3.97* 01/28/2015 1130   HGB 11.1* 01/28/2015 1130   HCT 33.7* 01/28/2015 1130   PLT 164 01/28/2015 1130   MCV 84.9 01/28/2015 1130   MCH 28.0 01/28/2015 1130   MCHC 32.9 01/28/2015 1130   RDW 17.3* 01/28/2015 1130   LYMPHSABS 0.7 01/28/2015 1130   MONOABS 0.5 01/28/2015 1130   EOSABS 0.1 01/28/2015 1130   BASOSABS 0.0 01/28/2015 1130      Chemistry      Component Value Date/Time   NA 137 01/28/2015 1130   K 4.2 01/28/2015 1130   CL 105 01/28/2015 1130   CO2 26 01/28/2015 1130   BUN 20 01/28/2015 1130   CREATININE 0.73 01/28/2015 1130      Component Value Date/Time   CALCIUM 8.4* 01/28/2015 1130   ALKPHOS 55 01/28/2015 1130   AST 19 01/28/2015 1130   ALT 15* 01/28/2015 1130   BILITOT 0.4 01/28/2015 1130     Lab Results  Component Value Date   CEA 0.9 11/13/2014      PENDING LABS:   RADIOGRAPHIC STUDIES:  No results found.   PATHOLOGY:    ASSESSMENT AND PLAN:  Rectal cancer Stage IIIB Rectal Cancer (T3N1a) S/Pconcomitant chemoradiation with 5FU CI.  Discussion regarding surgical resection.  Patient and family  agreeable.  Refer to Dr. Marcello Moores ([facility]) for surgical resection made.  Case discussed with Dr. Marcello Moores via [facility].  Following surgical resection, we can consider adjuvant therapy, such as Xeloda single agent, but not pursing adjuvant chemotherapy may be very reasonable as well.  We will discuss further in the future.  Return in 3-4 weeks for follow-up.    THERAPY PLAN:  Continue with treatment as planned along with symptom management.  All questions were answered. The patient knows to call the clinic with any problems, questions or concerns. We can certainly see the patient much sooner if necessary.  Patient and plan discussed with Dr. Ancil Linsey and she is in agreement with the aforementioned.   This note is electronically signed by: Robynn Pane, PA-C 02/13/2015 5:00 PM

## 2015-02-19 ENCOUNTER — Other Ambulatory Visit: Payer: Self-pay | Admitting: General Surgery

## 2015-02-19 DIAGNOSIS — C20 Malignant neoplasm of rectum: Secondary | ICD-10-CM

## 2015-02-19 NOTE — H&P (Signed)
Brad Sanchez 02/19/2015 9:52 AM Location: [facility] Patient #: [id number] DOB: 1934/11/07 Widowed / Language: [name] / Race: White Male History of Present Illness [name] MD; 9/38/1829 10:21 AM) Patient words: New-rectal cancer.  The patient is a 80 year old male who presents with colorectal cancer. This is a 80 year old male who was recently diagnosed with rectal cancer. He underwent a colonoscopy in June 2016 showed a large distal rectal mass with noncritical luminal narrowing. Biopsy was consistent with adenocarcinoma. CT scan showed no other signs of metastatic disease. EUS showed a T3 N1 (stage IIIB) rectal adenocarcinoma with the distal edge located approximately 1 cm from the internal anal verge. He has recently completed chemotherapy and radiation. He had some skin breakdown with radiation but has since recovered. He is currently having bowel movements every day with no bleeding. Other Problems [name], CMA; 02/19/2015 9:54 AM) Heart murmur Rectal Cancer  Past Surgical History [name], CMA; 02/19/2015 9:54 AM) No pertinent past surgical history  Diagnostic Studies History [name], CMA; 02/19/2015 9:54 AM) Colonoscopy within last year  Allergies [name], CMA; 02/19/2015 9:53 AM) No Known Drug Allergies 02/19/2015  Medication History [name], CMA; 02/19/2015 9:54 AM) Tylenol (325MG  Tablet, Oral) Active. LORazepam (1MG  Tablet, Oral) Active. Crestor (20MG  Tablet, Oral) Active.  Social History [name], CMA; 02/19/2015 9:54 AM) Caffeine use Carbonated beverages, Coffee, Tea. No alcohol use No drug use Tobacco use Former smoker.  Family History [name], CMA; 02/19/2015 9:54 AM) First Degree Relatives No pertinent family history     Review of Systems [name] CMA; 02/19/2015 9:54 AM) General Not Present- Appetite Loss, Chills, Fatigue, Fever, Night Sweats, Weight Gain and Weight  Loss. Skin Not Present- Change in Wart/Mole, Dryness, Hives, Jaundice, New Lesions, Non-Healing Wounds, Rash and Ulcer. HEENT Not Present- Earache, Hearing Loss, Hoarseness, Nose Bleed, Oral Ulcers, Ringing in the Ears, Seasonal Allergies, Sinus Pain, Sore Throat, Visual Disturbances, Wears glasses/contact lenses and Yellow Eyes. Respiratory Not Present- Bloody sputum, Chronic Cough, Difficulty Breathing, Snoring and Wheezing. Breast Not Present- Breast Mass, Breast Pain, Nipple Discharge and Skin Changes. Cardiovascular Not Present- Chest Pain, Difficulty Breathing Lying Down, Leg Cramps, Palpitations, Rapid Heart Rate, Shortness of Breath and Swelling of Extremities. Gastrointestinal Not Present- Abdominal Pain, Bloating, Bloody Stool, Change in Bowel Habits, Chronic diarrhea, Constipation, Difficulty Swallowing, Excessive gas, Gets full quickly at meals, Hemorrhoids, Indigestion, Nausea, Rectal Pain and Vomiting. Male Genitourinary Not Present- Blood in Urine, Change in Urinary Stream, Frequency, Impotence, Nocturia, Painful Urination, Urgency and Urine Leakage.  Vitals (Chemira Jones CMA; 02/19/2015 9:53 AM) 02/19/2015 9:52 AM Weight: 138.4 lb Height: 66in Body Surface Area: 1.71 m Body Mass Index: 22.34 kg/m Temp.: 97.3F(Oral)  Pulse: 64 (Regular)  BP: 120/70 (Sitting, Left Arm, Standard)     Physical Exam [name] MD; 9/37/1696 10:22 AM)  General Mental Status-Alert. General Appearance-Consistent with stated age. Hydration-Well hydrated. Voice-Normal.  Head and Neck Head-normocephalic, atraumatic with no lesions or palpable masses. Trachea-midline. Thyroid Gland Characteristics - normal size and consistency.  Eye Eyeball - Bilateral-Extraocular movements intact. Sclera/Conjunctiva - Bilateral-No scleral icterus.  Chest and Lung Exam Chest and lung exam reveals -quiet, even and easy respiratory effort with no use of accessory muscles  and on auscultation, normal breath sounds, no adventitious sounds and normal vocal resonance. Inspection Chest Wall - Normal. Back - normal.  Cardiovascular Cardiovascular examination reveals -normal heart sounds, regular rate and rhythm with no murmurs and normal pedal pulses bilaterally.  Abdomen Inspection Inspection of  the abdomen reveals - No Hernias. Palpation/Percussion Palpation and Percussion of the abdomen reveal - Soft, Non Tender, No Rebound tenderness, No Rigidity (guarding) and No hepatosplenomegaly. Auscultation Auscultation of the abdomen reveals - Bowel sounds normal.  Rectal Anorectal Exam External - normal external exam. Internal - Note: Mass palpated anteriorly invading into the proximal anal sphincter.  Neurologic Neurologic evaluation reveals -alert and oriented x 3 with no impairment of recent or remote memory. Mental Status-Normal.  Musculoskeletal Global Assessment -Note:no gross deformities.  Normal Exam - Left-Upper Extremity Strength Normal and Lower Extremity Strength Normal. Normal Exam - Right-Upper Extremity Strength Normal and Lower Extremity Strength Normal.    Assessment & Plan [name] MD; 6/43/3295 10:37 AM)  PRIMARY CANCER OF RECTUM (C20) Impression: 80 year old male who presents to the office with rectal cancer being treated with neoadjuvant chemotherapy and radiation. He completed this at the end of August. He is here today for surgical consultation. CT scan showed no evidence of metastatic disease. His endoscopic ultrasound showed a T3 N1 lesion. His CEA level 3 months ago was 0.9. On exam today there is tumor noted invading into the proximal anal canal. This would preclude him from any sphincter preserving surgery. He will need an APR. I would like to get a MRI of his pelvis in early October to evaluate the proximity of the tumor to the prostate. We will have him do a bowel prep the night before surgery. I will have him  marked by the ostomy nurse prior to surgery. I will plan on doing an open abdominal peroneal resection. The surgery and anatomy were described to the patient as well as the risks of surgery and the possible complications. These include: Bleeding, deep abdominal infections and possible wound complications such as hernia and infection, damage to adjacent structures, leak of surgical connections, which can lead to other surgeries and possibly an ostomy, possible need for other procedures, such as abscess drains in radiology, possible prolonged hospital stay, possible diarrhea from removal of part of the colon, possible constipation from narcotics, possible bowel, bladder or sexual dysfunction if having rectal surgery, prolonged fatigue/weakness or appetite loss, possible early recurrence of of disease, possible complications of their medical problems such as heart disease or arrhythmias or lung problems, death (less than 1%). I believe the patient understands and wishes to proceed with the surgery.

## 2015-03-13 ENCOUNTER — Encounter (HOSPITAL_COMMUNITY): Payer: Self-pay | Admitting: Hematology & Oncology

## 2015-03-13 ENCOUNTER — Encounter (HOSPITAL_COMMUNITY): Payer: Medicare Other | Attending: Hematology & Oncology | Admitting: Hematology & Oncology

## 2015-03-13 ENCOUNTER — Ambulatory Visit
Admission: RE | Admit: 2015-03-13 | Discharge: 2015-03-13 | Disposition: A | Discharge status: Home / Self Care | Payer: Medicare Other | Source: Ambulatory Visit | Attending: General Surgery | Admitting: General Surgery

## 2015-03-13 ENCOUNTER — Encounter (HOSPITAL_COMMUNITY): Payer: Medicare Other

## 2015-03-13 VITALS — BP 146/68 | HR 70 | Temp 97.9°F | Resp 18 | Wt 145.8 lb

## 2015-03-13 DIAGNOSIS — F0390 Unspecified dementia without behavioral disturbance: Secondary | ICD-10-CM | POA: Diagnosis not present

## 2015-03-13 DIAGNOSIS — C20 Malignant neoplasm of rectum: Secondary | ICD-10-CM | POA: Diagnosis not present

## 2015-03-13 MED ORDER — HEPARIN SOD (PORK) LOCK FLUSH 100 UNIT/ML IV SOLN
500.0000 [IU] | Freq: Once | INTRAVENOUS | Status: AC
  Administered 2015-03-13: 500 [IU] via INTRAVENOUS
  Filled 2015-03-13: qty 5

## 2015-03-13 MED ORDER — SODIUM CHLORIDE 0.9 % IJ SOLN
10.0000 mL | INTRAMUSCULAR | Status: DC | PRN
  Administered 2015-03-13: 10 mL via INTRAVENOUS
  Filled 2015-03-13: qty 10

## 2015-03-13 NOTE — Patient Instructions (Addendum)
[place] at [facility] Discharge Instructions  RECOMMENDATIONS MADE BY THE CONSULTANT AND ANY TEST RESULTS WILL BE SENT TO YOUR REFERRING PHYSICIAN.  Exam and discussion by Dr. Whitney Muse. Dr. Whitney Muse strongly recommends that you do the scan today and have the surgery as scheduled. The longer you wait the more difficult the surgery and you will have more pain. Return for follow-up in 6 weeks with office visit and port flush.  Thank you for choosing [place] [facility] to provide your oncology and hematology care.  To afford each patient quality time with our provider, please arrive at least 15 minutes before your scheduled appointment time.    You need to re-schedule your appointment should you arrive 10 or more minutes late.  We strive to give you quality time with our providers, and arriving late affects you and other patients whose appointments are after yours.  Also, if you no show three or more times for appointments you may be dismissed from the clinic at the providers discretion.     Again, thank you for choosing [facility]  Our hope is that these requests will decrease the amount of time that you wait before being seen by our physicians.       _____________________________________________________________  Should you have questions after your visit to [facility], please contact our office at [phone] between the hours of 8:30 a.m. and 4:30 p.m.  Voicemails left after 4:30 p.m. will not be returned until the following business day.  For prescription refill requests, have your pharmacy contact our office.

## 2015-03-13 NOTE — Progress Notes (Signed)
Please see doctors encounter for more inforamtion 

## 2015-03-13 NOTE — Progress Notes (Signed)
Brad Sanchez presented for Portacath access and flush. Proper placement of portacath confirmed by CXR. Portacath located right chest wall accessed with  H 20 needle. Good blood return present. Portacath flushed with 38ml NS and 500U/56ml Heparin and needle removed intact. Procedure without incident. Patient tolerated procedure well.

## 2015-03-13 NOTE — Progress Notes (Signed)
[facility] [place] [facility] Progress Note  Patient Care Team: Marjean Donna, MD as PCP - General (Family Medicine)  CHIEF COMPLAINTS/PURPOSE OF CONSULTATION:  11/13/2014 Colonoscopy on 11/13/2014 with large distal rectal mass with noncritical luminal narrowing (Dr. Laural Golden) Biopsy c/w adenocarcinoma  CT C/A/P on 11/13/2014 with bulky annular soft tissue mass involving the rectum measuring approximately 5.6 x 5.5 x 6.5 cm, c/w newly diagnosed rectal carcinoma. No evidence of metastatic disease  Flexible sigmoidoscopy EUS with Dr. Ardis Hughs showing a large nearly circumferential friable mass in the rectum. The masses 5 cm across, distal edge about 1 cm from the anal verge. Lesion clearly passes into and through the muscularis propria layer of the rectal wall, 1.9 cm in depth uT3, 1 small but suspicious perirectal lymph node (uT3N1) Stage IIIb  Neoadjuvant CI 5-FU and XRT  HISTORY OF PRESENTING ILLNESS:  Brad Sanchez 80 y.o. male is here for follow-up of stage IIIB adenocarcinoma of the rectum. It has been challenging to treat him secondary to his dementia.    He has surgery scheduled for the 12th.  Apparently the patient has canceled his MRI and pre-op. He says he feels great and does not understand why he should proceed with surgery. His family has just found out about this.  Discussed living will and health care power of attorney with the patient.  He notes that he is feeling well right now.  He has been mowing and walking and states that he does not want to be "drug down" or "messed with" at this time. He admits that he will not want a colostomy.  His children note that they are not sure that the patient fully understands his options and want him aware of the decision that he is making.  He questions recovery time after surgery.  The family has left all of his previously scheduled appointments the same.  He is scheduled to have his pre-surgical MRI done today.     MEDICAL  HISTORY:  Past Medical History  Diagnosis Date  . High cholesterol   . Anxiety   . Heart murmur     Dr. Emilee Hero told him 15 years ago  . Headache   . Cancer [facility])     rectal    SURGICAL HISTORY: Past Surgical History  Procedure Laterality Date  . Colonoscopy N/A 11/13/2014    Procedure: COLONOSCOPY;  Surgeon: Rogene Houston, MD;  Location: [facility];  Service: Endoscopy;  Laterality: N/A;  730  . Eus N/A 12/12/2014    Procedure: LOWER ENDOSCOPIC ULTRASOUND (EUS);  Surgeon: Milus Banister, MD;  Location: [facility];  Service: Endoscopy;  Laterality: N/A;    SOCIAL HISTORY: Social History   Social History  . Marital Status: Widowed    Spouse Name: N/A  . Number of Children: N/A  . Years of Education: N/A   Occupational History  . retired    Social History Main Topics  . Smoking status: Light Tobacco Smoker    Types: Cigars  . Smokeless tobacco: Never Used  . Alcohol Use: No  . Drug Use: No  . Sexual Activity: Not on file   Other Topics Concern  . Not on file   Social History Narrative  He is widowed, his wife was taken care of at [place] for 15 yrs.  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 No pertinent family history. indicated that his mother is deceased. He reported the following about his father: unknown.  Mother deceased, age 9, breast cancer   Only child  ALLERGIES:  has No Known Allergies.  MEDICATIONS:  Current Outpatient Prescriptions  Medication Sig Dispense Refill  . acetaminophen (TYLENOL) 325 MG tablet Take 650 mg by mouth every 6 (six) hours as needed for moderate pain.     . rosuvastatin (CRESTOR) 20 MG tablet Take 20 mg by mouth daily.      Marland Kitchen lidocaine-prilocaine (EMLA) cream APPLY QUARTER SIZE TO PORT SITE ONE HOUR PRIOR TO CHEMO. DO NOT RUB IN. COVER WITH PLASTIC WRAP.  (Patient not taking: Reported on 03/13/2015) 30 g 6  . Loperamide HCl ([facility] ANTI-DIARRHEA PO) Take 1 tablet by mouth daily as needed (diarrhea).    . LORazepam (ATIVAN) 1 MG tablet Take 1 mg by mouth daily as needed for anxiety or sleep (Take at bedtime).     . ondansetron (ZOFRAN) 8 MG tablet Take 1 tablet (8 mg total) by mouth every 8 (eight) hours as needed for nausea or vomiting. (Patient not taking: Reported on 03/13/2015) 30 tablet 1  . Sennosides (EX-LAX MAXIMUM STRENGTH) 25 MG TABS Take 1 tablet by mouth daily as needed (constipation).     Current Facility-Administered Medications  Medication Dose Route Frequency Provider Last Rate Last Dose  . heparin lock flush 100 unit/mL  500 Units Intravenous Once Patrici Ranks, MD      . sodium chloride 0.9 % injection 10 mL  10 mL Intravenous PRN Patrici Ranks, MD        Review of Systems  Constitutional: Negative.  Mild confusion at times. Cardiovascular: Negative Gastrointestinal: Diarrhea, XRT skin changes, "mild pain" Skin: Negative  Neurological: Negative.  All other systems reviewed and are negative.  14 point ROS was done and is otherwise as detailed above or in HPI   PHYSICAL EXAMINATION: ECOG PERFORMANCE STATUS: 1 - Symptomatic but completely ambulatory  Filed Vitals:   03/13/15 1023  BP: 146/68  Pulse: 70  Temp: 97.9 F (36.6 C)  Resp: 18   Filed Weights   03/13/15 1023  Weight: 145 lb 12.8 oz (66.134 kg)    Physical Exam  Constitutional: He is oriented to person, place, and time and well-developed, well-nourished, and in no distress.  Pleasant Thin HENT:  Head: Normocephalic and atraumatic.  Nose: Nose normal.  Mouth/Throat: Oropharynx is clear and moist. No oropharyngeal exudate.  Eyes: Conjunctivae and EOM are normal. Pupils are equal, round, and reactive to light. Right eye exhibits no discharge. Left eye exhibits no discharge. No scleral icterus.  Neck: Normal range of motion. Neck supple. No tracheal  deviation present. No thyromegaly present.  Cardiovascular: Normal rate and regular rhythm.  Exam reveals no gallop and no friction rub.   Murmur heard. Systolic ejection murmur  Pulmonary/Chest: Effort normal and breath sounds normal. He has no wheezes. He has no rales.  Abdominal: Soft. Bowel sounds are normal. He exhibits no distension and no mass. There is no tenderness. There is no rebound and no guarding.  Musculoskeletal: Normal range of motion. He exhibits no edema.  Lymphadenopathy:    He has no cervical adenopathy.  Neurological: He is alert and oriented to person, place, and time. He has normal reflexes. No cranial nerve deficit. Gait normal. Coordination normal.  Skin: Skin is warm and dry. No rash noted.  Psychiatric: Mood and memory are ok.  At baseline Nursing note and vitals reviewed.    LABORATORY DATA:  I have reviewed the data as listed Lab Results  Component Value Date   WBC 4.6 01/28/2015   HGB 11.1* 01/28/2015   HCT 33.7* 01/28/2015   MCV 84.9 01/28/2015   PLT 164 01/28/2015   CMP     Component Value Date/Time   NA 137 01/28/2015 1130   K 4.2 01/28/2015 1130   CL 105 01/28/2015 1130   CO2 26 01/28/2015 1130   GLUCOSE 98 01/28/2015 1130   BUN 20 01/28/2015 1130   CREATININE 0.73 01/28/2015 1130   CALCIUM 8.4* 01/28/2015 1130   PROT 5.9* 01/28/2015 1130   ALBUMIN 3.1* 01/28/2015 1130   AST 19 01/28/2015 1130   ALT 15* 01/28/2015 1130   ALKPHOS 55 01/28/2015 1130   BILITOT 0.4 01/28/2015 1130   GFRNONAA >60 01/28/2015 1130   GFRAA >60 01/28/2015 1130      ASSESSMENT & PLAN:  Rectal Adenocarcinoma CT C/A/P on 11/13/2014 without distant metastatic disease uT3 N1 disease, Stage IIIB Mild Dementia Weight loss Neoadjuvant XRT/infusional 5-Fu started 12/24/2015 Questionable medical competence  I discussed with the patient and family the decision not to proceed with surgery. I advised the patient that at some point the tumor would progress and he will  have to have surgery, potentially an urgent setting. His goal is to proceed forward with minimal pain and discomfort and I have advised him that the short-term discomfort associated with surgery now should have guarantee him less discomfort in the future. I have encouraged him to move forward with planned surgery. I advised him that although I cannot predict the timing at some point in the future his rectal tumor will progress and become much more problematic than what we have currently dealt with.  We discussed a living will, healthcare power of attorney and DO NOT RESUSCITATE status. I really question the patient's medical competence. I know that his family does as well. This has to be addressed moving forward as well.  He agrees to go for the scheduled presurgical MRI today. We will do the surgery date in place. The family will call Dr. Manon Hilding office. I will tentatively see him back in 6 weeks.  All questions were answered. The patient knows to call the clinic with any problems, questions or concerns.   This note was electronically signed.    This document serves as a record of services personally performed by Ancil Linsey, MD. It was created on her behalf by Janace Hoard, a trained medical scribe. The creation of this record is based on the scribe's personal observations and the provider's statements to them. This document has been checked and approved by the attending provider.  I have reviewed the above documentation for accuracy and completeness, and I agree with the above.  Kelby Fam. Whitney Muse, MD

## 2015-03-17 ENCOUNTER — Other Ambulatory Visit: Payer: Self-pay | Admitting: General Surgery

## 2015-03-17 ENCOUNTER — Encounter (HOSPITAL_COMMUNITY)
Admission: RE | Admit: 2015-03-17 | Discharge: 2015-03-17 | Disposition: A | Discharge status: Home / Self Care | Payer: Medicare Other | Source: Ambulatory Visit | Attending: General Surgery | Admitting: General Surgery

## 2015-03-17 ENCOUNTER — Ambulatory Visit
Admission: RE | Admit: 2015-03-17 | Discharge: 2015-03-17 | Disposition: A | Discharge status: Home / Self Care | Payer: Medicare Other | Source: Ambulatory Visit | Attending: General Surgery | Admitting: General Surgery

## 2015-03-17 ENCOUNTER — Encounter (HOSPITAL_COMMUNITY): Payer: Self-pay

## 2015-03-17 DIAGNOSIS — C20 Malignant neoplasm of rectum: Secondary | ICD-10-CM

## 2015-03-17 LAB — BASIC METABOLIC PANEL
Anion gap: 5 (ref 5–15)
BUN: 16 mg/dL (ref 6–20)
CALCIUM: 8.8 mg/dL — AB (ref 8.9–10.3)
CO2: 26 mmol/L (ref 22–32)
CREATININE: 0.84 mg/dL (ref 0.61–1.24)
Chloride: 108 mmol/L (ref 101–111)
GFR calc non Af Amer: >60 mL/min (ref >60)
Glucose, Bld: 84 mg/dL (ref 65–99)
Potassium: 3.7 mmol/L (ref 3.5–5.1)
SODIUM: 139 mmol/L (ref 135–145)

## 2015-03-17 LAB — CBC
HEMATOCRIT: 36 % — AB (ref 39.0–52.0)
Hemoglobin: 11.2 g/dL — ABNORMAL LOW (ref 13.0–17.0)
MCH: 26.4 pg (ref 26.0–34.0)
MCHC: 31.1 g/dL (ref 30.0–36.0)
MCV: 84.9 fL (ref 78.0–100.0)
Platelets: 147 10*3/uL — ABNORMAL LOW (ref 150–400)
RBC: 4.24 MIL/uL (ref 4.22–5.81)
RDW: 15 % (ref 11.5–15.5)
WBC: 5.4 10*3/uL (ref 4.0–10.5)

## 2015-03-17 LAB — ABO/RH: ABO/RH(D): B POS

## 2015-03-17 NOTE — Patient Instructions (Addendum)
Brad Sanchez  03/17/2015   Your procedure is scheduled on: March 19, 2015  Report to [facility]  Entrance take [place]  elevators to 3rd floor to  [place] at 9:30 AM.  Call this number if you have problems the morning of surgery [phone]   Remember: ONLY 1 PERSON MAY GO WITH YOU TO SHORT STAY TO GET  READY MORNING OF [place].  Do not eat food  :After Midnight STARTING Monday AT MIDNIGHT!-NO SOLID FOODS!             REMEMBER TO FOLLOW BOWEL PREP INSTRUCTIONS FROM DR. THOMAS'S OFFICE ON Tuesday 03/18/2015 -AND DRINK CLEAR LIQUIDS ALL DAY ON Tuesday 03/18/2015! (SEE LIST BELOW)   CLEAR LIQUID DIET   Foods Allowed                                                                     Foods Excluded  Coffee and tea, regular and decaf                             liquids that you cannot  Plain Jell-O in any flavor                                             see through such as: Fruit ices (not with fruit pulp)                                     milk, soups, orange juice  Iced Popsicles                                    All solid food Carbonated beverages, regular and diet                                    Cranberry, grape and apple juices Sports drinks like Gatorade Lightly seasoned clear broth or consume(fat free) Sugar, honey syrup  Sample Menu Breakfast                                Lunch                                     Supper Cranberry juice                    Beef broth                            Chicken broth Jell-O  Grape juice                           Apple juice Coffee or tea                        Jell-O                                      Popsicle                                                Coffee or tea                        Coffee or tea  _____________________________________________________________________ [facility] - Preparing for Surgery Before surgery, you can play  an important role.  Because skin is not sterile, your skin needs to be as free of germs as possible.  You can reduce the number of germs on your skin by washing with CHG (chlorahexidine gluconate) soap before surgery.  CHG is an antiseptic cleaner which kills germs and bonds with the skin to continue killing germs even after washing. Please DO NOT use if you have an allergy to CHG or antibacterial soaps.  If your skin becomes reddened/irritated stop using the CHG and inform your nurse when you arrive at Short Stay. Do not shave (including legs and underarms) for at least 48 hours prior to the first CHG shower.  You may shave your face/neck. Please follow these instructions carefully:  1.  Shower with CHG Soap the night before surgery and the  morning of Surgery.  2.  If you choose to wash your hair, wash your hair first as usual with your  normal  shampoo.  3.  After you shampoo, rinse your hair and body thoroughly to remove the  shampoo.                           4.  Use CHG as you would any other liquid soap.  You can apply chg directly  to the skin and wash                       Gently with a scrungie or clean washcloth.  5.  Apply the CHG Soap to your body ONLY FROM THE NECK DOWN.   Do not use on face/ open                           Wound or open sores. Avoid contact with eyes, ears mouth and genitals (private parts).                       Wash face,  Genitals (private parts) with your normal soap.             6.  Wash thoroughly, paying special attention to the area where your surgery  will be performed.  7.  Thoroughly rinse your body with warm water from the neck down.  8.  DO NOT shower/wash with your normal soap after using and rinsing off  the CHG Soap.  9.  Pat yourself dry with a clean towel.            10.  Wear clean pajamas.            11.  Place clean sheets on your bed the night of your first shower and do not  sleep with pets. Day of Surgery : Do not apply any  lotions/deodorants the morning of surgery.  Please wear clean clothes to the hospital/surgery center.  FAILURE TO FOLLOW THESE INSTRUCTIONS MAY RESULT IN THE CANCELLATION OF YOUR SURGERY PATIENT SIGNATURE_________________________________  NURSE SIGNATURE__________________________________  ________________________________________________________________________   Brad Sanchez  An incentive spirometer is a tool that can help keep your lungs clear and active. This tool measures how well you are filling your lungs with each breath. Taking long deep breaths may help reverse or decrease the chance of developing breathing (pulmonary) problems (especially infection) following:  A long period of time when you are unable to move or be active. BEFORE THE PROCEDURE   If the spirometer includes an indicator to show your best effort, your nurse or respiratory therapist will set it to a desired goal.  If possible, sit up straight or lean slightly forward. Try not to slouch.  Hold the incentive spirometer in an upright position. INSTRUCTIONS FOR USE   Sit on the edge of your bed if possible, or sit up as far as you can in bed or on a chair.  Hold the incentive spirometer in an upright position.  Breathe out normally.  Place the mouthpiece in your mouth and seal your lips tightly around it.  Breathe in slowly and as deeply as possible, raising the piston or the ball toward the top of the column.  Hold your breath for 3-5 seconds or for as long as possible. Allow the piston or ball to fall to the bottom of the column.  Remove the mouthpiece from your mouth and breathe out normally.  Rest for a few seconds and repeat Steps 1 through 7 at least 10 times every 1-2 hours when you are awake. Take your time and take a few normal breaths between deep breaths.  The spirometer may include an indicator to show your best effort. Use the indicator as a goal to work toward during each  repetition.  After each set of 10 deep breaths, practice coughing to be sure your lungs are clear. If you have an incision (the cut made at the time of surgery), support your incision when coughing by placing a pillow or rolled up towels firmly against it. Once you are able to get out of bed, walk around indoors and cough well. You may stop using the incentive spirometer when instructed by your caregiver.  RISKS AND COMPLICATIONS  Take your time so you do not get dizzy or light-headed.  If you are in pain, you may need to take or ask for pain medication before doing incentive spirometry. It is harder to take a deep breath if you are having pain. AFTER USE  Rest and breathe slowly and easily.  It can be helpful to keep track of a log of your progress. Your caregiver can provide you with a simple table to help with this. If you are using the spirometer at home, follow these instructions: [place] IF:   You are having difficultly using the spirometer.  You have trouble using the spirometer as often as instructed.  Your pain medication is not giving enough relief while using the spirometer.  You  develop fever of 100.5 F (38.1 C) or higher. SEEK IMMEDIATE MEDICAL CARE IF:   You cough up bloody sputum that had not been present before.  You develop fever of 102 F (38.9 C) or greater.  You develop worsening pain at or near the incision site. MAKE SURE YOU:   Understand these instructions.  Will watch your condition.  Will get help right away if you are not doing well or get worse. Document Released: 10/04/2006 Document Revised: 08/16/2011 Document Reviewed: 12/05/2006 ExitCare Patient Information 2014 ExitCare, [place].   ________________________________________________________________________  WHAT IS A BLOOD TRANSFUSION? Blood Transfusion Information  A transfusion is the replacement of blood or some of its parts. Blood is made up of multiple cells which provide  different functions.  Red blood cells carry oxygen and are used for blood loss replacement.  White blood cells fight against infection.  Platelets control bleeding.  Plasma helps clot blood.  Other blood products are available for specialized needs, such as hemophilia or other clotting disorders. BEFORE THE TRANSFUSION  Who gives blood for transfusions?   Healthy volunteers who are fully evaluated to make sure their blood is safe. This is blood bank blood. Transfusion therapy is the safest it has ever been in the practice of medicine. Before blood is taken from a donor, a complete history is taken to make sure that person has no history of diseases nor engages in risky social behavior (examples are intravenous drug use or sexual activity with multiple partners). The donor's travel history is screened to minimize risk of transmitting infections, such as malaria. The donated blood is tested for signs of infectious diseases, such as HIV and hepatitis. The blood is then tested to be sure it is compatible with you in order to minimize the chance of a transfusion reaction. If you or a relative donates blood, this is often done in anticipation of surgery and is not appropriate for emergency situations. It takes many days to process the donated blood. RISKS AND COMPLICATIONS Although transfusion therapy is very safe and saves many lives, the main dangers of transfusion include:   Getting an infectious disease.  Developing a transfusion reaction. This is an allergic reaction to something in the blood you were given. Every precaution is taken to prevent this. The decision to have a blood transfusion has been considered carefully by your caregiver before blood is given. Blood is not given unless the benefits outweigh the risks. AFTER THE TRANSFUSION  Right after receiving a blood transfusion, you will usually feel much better and more energetic. This is especially true if your red blood cells have  gotten low (anemic). The transfusion raises the level of the red blood cells which carry oxygen, and this usually causes an energy increase.  The nurse administering the transfusion will monitor you carefully for complications. HOME CARE INSTRUCTIONS  No special instructions are needed after a transfusion. You may find your energy is better. Speak with your caregiver about any limitations on activity for underlying diseases you may have. SEEK MEDICAL CARE IF:   Your condition is not improving after your transfusion.  You develop redness or irritation at the intravenous (IV) site. SEEK IMMEDIATE MEDICAL CARE IF:  Any of the following symptoms occur over the next 12 hours:  Shaking chills.  You have a temperature by mouth above 102 F (38.9 C), not controlled by medicine.  Chest, back, or muscle pain.  People around you feel you are not acting correctly or are confused.  Shortness of  breath or difficulty breathing.  Dizziness and fainting.  You get a rash or develop hives.  You have a decrease in urine output.  Your urine turns a dark color or changes to pink, red, or brown. Any of the following symptoms occur over the next 10 days:  You have a temperature by mouth above 102 F (38.9 C), not controlled by medicine.  Shortness of breath.  Weakness after normal activity.  The white part of the eye turns yellow (jaundice).  You have a decrease in the amount of urine or are urinating less often.  Your urine turns a dark color or changes to pink, red, or brown. Document Released: 05/21/2000 Document Revised: 08/16/2011 Document Reviewed: 01/08/2008 [facility] Patient Information 2014 [place], [place].  _______________________________________________________________________ .

## 2015-03-17 NOTE — Consult Note (Addendum)
WOC ostomy consult note Stoma type/location: Stoma site marking for Colostomy Education provided:  Discussed surgical procedure and stoma creation with patient, son and daughter-in-law. Explained role of the [place] nurse team. Provided the patient with educational booklet/DVD and provided samples of pouching options. Answered questions related to activity level after recovery, pouch change frequency, and self care.    Examined patient reclining, sitting, and standing in order to place the marking in the patient's visual field, away from any creases or abdominal contour issues and within the rectus muscle.  Patient wears his pants well below umbilicus and preferred the site to be higher, within his field of vision.  Creasing at umbilicus noted and bulging at LLQ concerning for hernia, so this area was avoided.   Marked for colostomy in the LUQ 2 cm to the left of the umbilicus and 2 cm above the umbilicus.  Enrolled patient in [place] program: No WOC team will follow for ongoing ostomy education and support.  Domenic Moras RN BSN [place] Pager [phone]

## 2015-03-18 ENCOUNTER — Other Ambulatory Visit: Payer: Medicare Other

## 2015-03-18 LAB — HEMOGLOBIN A1C
Hgb A1c MFr Bld: 5.7 % — ABNORMAL HIGH (ref 4.8–5.6)
MEAN PLASMA GLUCOSE: 117 mg/dL

## 2015-03-19 ENCOUNTER — Encounter (HOSPITAL_COMMUNITY): Payer: Self-pay | Admitting: Anesthesiology

## 2015-03-19 ENCOUNTER — Inpatient Hospital Stay (HOSPITAL_COMMUNITY)
Admission: RE | Admit: 2015-03-19 | Discharge: 2015-03-24 | DRG: 330 | Disposition: A | Discharge status: Home Health | Payer: Medicare Other | Source: Ambulatory Visit | Attending: Surgery | Admitting: Surgery

## 2015-03-19 ENCOUNTER — Encounter (HOSPITAL_COMMUNITY)
Admission: RE | Disposition: A | Discharge status: Home / Self Care | Payer: Self-pay | Source: Ambulatory Visit | Attending: General Surgery

## 2015-03-19 ENCOUNTER — Inpatient Hospital Stay (HOSPITAL_COMMUNITY): Payer: Medicare Other | Admitting: Anesthesiology

## 2015-03-19 DIAGNOSIS — D62 Acute posthemorrhagic anemia: Secondary | ICD-10-CM | POA: Diagnosis not present

## 2015-03-19 DIAGNOSIS — C20 Malignant neoplasm of rectum: Secondary | ICD-10-CM | POA: Diagnosis present

## 2015-03-19 DIAGNOSIS — Z01812 Encounter for preprocedural laboratory examination: Secondary | ICD-10-CM

## 2015-03-19 DIAGNOSIS — C19 Malignant neoplasm of rectosigmoid junction: Secondary | ICD-10-CM | POA: Diagnosis present

## 2015-03-19 DIAGNOSIS — Z933 Colostomy status: Secondary | ICD-10-CM

## 2015-03-19 DIAGNOSIS — Z923 Personal history of irradiation: Secondary | ICD-10-CM

## 2015-03-19 DIAGNOSIS — Z9221 Personal history of antineoplastic chemotherapy: Secondary | ICD-10-CM | POA: Diagnosis not present

## 2015-03-19 HISTORY — PX: XI ROBOTIC ASSISTED LOWER ANTERIOR RESECTION: SHX6558

## 2015-03-19 LAB — TYPE AND SCREEN
ABO/RH(D): B POS
ANTIBODY SCREEN: NEGATIVE

## 2015-03-19 SURGERY — RESECTION, RECTUM, LOW ANTERIOR, ROBOT-ASSISTED
Anesthesia: General

## 2015-03-19 MED ORDER — SUGAMMADEX SODIUM 200 MG/2ML IV SOLN
INTRAVENOUS | Status: DC | PRN
  Administered 2015-03-19: 250 mg via INTRAVENOUS

## 2015-03-19 MED ORDER — LACTATED RINGERS IV SOLN: INTRAVENOUS | Status: DC

## 2015-03-19 MED ORDER — ALVIMOPAN 12 MG PO CAPS
12.0000 mg | ORAL_CAPSULE | Freq: Once | ORAL | Status: AC
  Administered 2015-03-19: 12 mg via ORAL
  Filled 2015-03-19: qty 1

## 2015-03-19 MED ORDER — DIPHENHYDRAMINE HCL 12.5 MG/5ML PO ELIX: 12.5000 mg | ORAL_SOLUTION | Freq: Four times a day (QID) | ORAL | Status: DC | PRN

## 2015-03-19 MED ORDER — SODIUM CHLORIDE 0.9 % IJ SOLN
INTRAMUSCULAR | Status: AC
  Filled 2015-03-19: qty 10

## 2015-03-19 MED ORDER — ONDANSETRON HCL 4 MG/2ML IJ SOLN: 4.0000 mg | Freq: Four times a day (QID) | INTRAMUSCULAR | Status: DC | PRN

## 2015-03-19 MED ORDER — LABETALOL HCL 5 MG/ML IV SOLN
INTRAVENOUS | Status: DC | PRN
  Administered 2015-03-19 (×2): 2.5 mg via INTRAVENOUS

## 2015-03-19 MED ORDER — SUGAMMADEX SODIUM 500 MG/5ML IV SOLN
INTRAVENOUS | Status: AC
  Filled 2015-03-19: qty 5

## 2015-03-19 MED ORDER — MORPHINE SULFATE 1 MG/ML IV SOLN
INTRAVENOUS | Status: DC
  Administered 2015-03-19: 17:00:00 via INTRAVENOUS
  Administered 2015-03-19: 3 mg via INTRAVENOUS
  Administered 2015-03-19 – 2015-03-20 (×2): 6 mg via INTRAVENOUS
  Administered 2015-03-20: 7.5 mg via INTRAVENOUS
  Administered 2015-03-20: 6 mg via INTRAVENOUS
  Administered 2015-03-20: 7.5 mg via INTRAVENOUS
  Administered 2015-03-21: 3 mg via INTRAVENOUS
  Administered 2015-03-21: 15 mg via INTRAVENOUS
  Administered 2015-03-21: 7.5 mg via INTRAVENOUS
  Filled 2015-03-19 (×3): qty 25

## 2015-03-19 MED ORDER — KCL IN DEXTROSE-NACL 30-5-0.45 MEQ/L-%-% IV SOLN
INTRAVENOUS | Status: DC
  Administered 2015-03-19: 100 mL/h via INTRAVENOUS
  Administered 2015-03-20: 08:00:00 via INTRAVENOUS
  Administered 2015-03-20: 75 mL/h via INTRAVENOUS
  Administered 2015-03-22: 06:00:00 via INTRAVENOUS
  Filled 2015-03-19 (×5): qty 1000

## 2015-03-19 MED ORDER — HYDROMORPHONE HCL 1 MG/ML IJ SOLN
INTRAMUSCULAR | Status: DC | PRN
  Administered 2015-03-19 (×2): .4 mg via INTRAVENOUS
  Administered 2015-03-19 (×2): .2 mg via INTRAVENOUS
  Administered 2015-03-19 (×2): .4 mg via INTRAVENOUS

## 2015-03-19 MED ORDER — LIDOCAINE HCL (CARDIAC) 20 MG/ML IV SOLN
INTRAVENOUS | Status: DC | PRN
  Administered 2015-03-19: 50 mg via INTRAVENOUS

## 2015-03-19 MED ORDER — HYDROMORPHONE HCL 1 MG/ML IJ SOLN: 0.2500 mg | INTRAMUSCULAR | Status: DC | PRN

## 2015-03-19 MED ORDER — METOCLOPRAMIDE HCL 5 MG/ML IJ SOLN
INTRAMUSCULAR | Status: AC
Start: 2015-03-19 — End: 2015-03-19
  Filled 2015-03-19: qty 2

## 2015-03-19 MED ORDER — LACTATED RINGERS IV SOLN
INTRAVENOUS | Status: DC
  Administered 2015-03-19: 11:00:00 via INTRAVENOUS

## 2015-03-19 MED ORDER — BUPIVACAINE-EPINEPHRINE (PF) 0.25% -1:200000 IJ SOLN
INTRAMUSCULAR | Status: AC
  Filled 2015-03-19: qty 30

## 2015-03-19 MED ORDER — HYDROMORPHONE HCL 2 MG/ML IJ SOLN
INTRAMUSCULAR | Status: AC
  Filled 2015-03-19: qty 1

## 2015-03-19 MED ORDER — MORPHINE SULFATE 1 MG/ML IV SOLN
INTRAVENOUS | Status: AC
  Filled 2015-03-19: qty 25

## 2015-03-19 MED ORDER — HEPARIN SODIUM (PORCINE) 5000 UNIT/ML IJ SOLN
5000.0000 [IU] | Freq: Once | INTRAMUSCULAR | Status: AC
  Administered 2015-03-19: 5000 [IU] via SUBCUTANEOUS
  Filled 2015-03-19: qty 1

## 2015-03-19 MED ORDER — ACETAMINOPHEN 500 MG PO TABS
1000.0000 mg | ORAL_TABLET | Freq: Four times a day (QID) | ORAL | Status: AC
  Administered 2015-03-20: 1000 mg via ORAL
  Filled 2015-03-19: qty 2

## 2015-03-19 MED ORDER — CEFOTETAN DISODIUM-DEXTROSE 2-2.08 GM-% IV SOLR
INTRAVENOUS | Status: AC
  Filled 2015-03-19: qty 50

## 2015-03-19 MED ORDER — 0.9 % SODIUM CHLORIDE (POUR BTL) OPTIME
TOPICAL | Status: DC | PRN
  Administered 2015-03-19: 1000 mL

## 2015-03-19 MED ORDER — CHLORHEXIDINE GLUCONATE 0.12 % MT SOLN
15.0000 mL | Freq: Two times a day (BID) | OROMUCOSAL | Status: DC
  Administered 2015-03-19 – 2015-03-23 (×8): 15 mL via OROMUCOSAL
  Filled 2015-03-19 (×11): qty 15

## 2015-03-19 MED ORDER — BUPIVACAINE-EPINEPHRINE 0.25% -1:200000 IJ SOLN
INTRAMUSCULAR | Status: DC | PRN
  Administered 2015-03-19: 17 mL

## 2015-03-19 MED ORDER — NALOXONE HCL 0.4 MG/ML IJ SOLN: 0.4000 mg | INTRAMUSCULAR | Status: DC | PRN

## 2015-03-19 MED ORDER — DEXTROSE 5 % IV SOLN
2.0000 g | Freq: Two times a day (BID) | INTRAVENOUS | Status: AC
  Administered 2015-03-20: 2 g via INTRAVENOUS
  Filled 2015-03-19: qty 2

## 2015-03-19 MED ORDER — LABETALOL HCL 5 MG/ML IV SOLN
INTRAVENOUS | Status: AC
  Filled 2015-03-19: qty 8

## 2015-03-19 MED ORDER — SUCCINYLCHOLINE CHLORIDE 20 MG/ML IJ SOLN
INTRAMUSCULAR | Status: DC | PRN
  Administered 2015-03-19: 100 mg via INTRAVENOUS

## 2015-03-19 MED ORDER — LACTATED RINGERS IV SOLN
INTRAVENOUS | Status: DC | PRN
  Administered 2015-03-19 (×4): via INTRAVENOUS

## 2015-03-19 MED ORDER — PROPOFOL 10 MG/ML IV BOLUS
INTRAVENOUS | Status: DC | PRN
  Administered 2015-03-19: 130 mg via INTRAVENOUS

## 2015-03-19 MED ORDER — ONDANSETRON HCL 4 MG/2ML IJ SOLN
INTRAMUSCULAR | Status: DC | PRN
  Administered 2015-03-19 (×2): 4 mg via INTRAVENOUS

## 2015-03-19 MED ORDER — FENTANYL CITRATE (PF) 250 MCG/5ML IJ SOLN
INTRAMUSCULAR | Status: AC
  Filled 2015-03-19: qty 25

## 2015-03-19 MED ORDER — DIPHENHYDRAMINE HCL 50 MG/ML IJ SOLN: 12.5000 mg | Freq: Four times a day (QID) | INTRAMUSCULAR | Status: DC | PRN

## 2015-03-19 MED ORDER — LACTATED RINGERS IR SOLN
Status: DC | PRN
  Administered 2015-03-19: 1

## 2015-03-19 MED ORDER — DEXAMETHASONE SODIUM PHOSPHATE 10 MG/ML IJ SOLN
INTRAMUSCULAR | Status: AC
  Filled 2015-03-19: qty 1

## 2015-03-19 MED ORDER — ALVIMOPAN 12 MG PO CAPS
12.0000 mg | ORAL_CAPSULE | Freq: Two times a day (BID) | ORAL | Status: DC
  Administered 2015-03-20 – 2015-03-21 (×3): 12 mg via ORAL
  Filled 2015-03-19 (×4): qty 1

## 2015-03-19 MED ORDER — SODIUM CHLORIDE 0.9 % IJ SOLN: 9.0000 mL | INTRAMUSCULAR | Status: DC | PRN

## 2015-03-19 MED ORDER — ROCURONIUM BROMIDE 100 MG/10ML IV SOLN
INTRAVENOUS | Status: AC
  Filled 2015-03-19: qty 1

## 2015-03-19 MED ORDER — CETYLPYRIDINIUM CHLORIDE 0.05 % MT LIQD
7.0000 mL | Freq: Two times a day (BID) | OROMUCOSAL | Status: DC
  Administered 2015-03-20 – 2015-03-22 (×4): 7 mL via OROMUCOSAL

## 2015-03-19 MED ORDER — ONDANSETRON HCL 4 MG/2ML IJ SOLN
INTRAMUSCULAR | Status: AC
  Filled 2015-03-19: qty 2

## 2015-03-19 MED ORDER — DEXTROSE 5 % IV SOLN
2.0000 g | INTRAVENOUS | Status: AC
  Administered 2015-03-19 (×2): 2 g via INTRAVENOUS
  Filled 2015-03-19: qty 2

## 2015-03-19 MED ORDER — CEFAZOLIN SODIUM-DEXTROSE 2-3 GM-% IV SOLR: INTRAVENOUS | Status: DC | PRN

## 2015-03-19 MED ORDER — DEXAMETHASONE SODIUM PHOSPHATE 10 MG/ML IJ SOLN
INTRAMUSCULAR | Status: DC | PRN
  Administered 2015-03-19: 10 mg via INTRAVENOUS

## 2015-03-19 MED ORDER — METOCLOPRAMIDE HCL 5 MG/ML IJ SOLN
INTRAMUSCULAR | Status: DC | PRN
  Administered 2015-03-19: 10 mg via INTRAVENOUS

## 2015-03-19 MED ORDER — ROCURONIUM BROMIDE 100 MG/10ML IV SOLN
INTRAVENOUS | Status: DC | PRN
  Administered 2015-03-19 (×2): 10 mg via INTRAVENOUS
  Administered 2015-03-19: 20 mg via INTRAVENOUS
  Administered 2015-03-19: 10 mg via INTRAVENOUS
  Administered 2015-03-19: 50 mg via INTRAVENOUS

## 2015-03-19 MED ORDER — ENOXAPARIN SODIUM 40 MG/0.4ML ~~LOC~~ SOLN
40.0000 mg | SUBCUTANEOUS | Status: DC
  Administered 2015-03-20 – 2015-03-24 (×5): 40 mg via SUBCUTANEOUS
  Filled 2015-03-19 (×7): qty 0.4

## 2015-03-19 MED ORDER — FENTANYL CITRATE (PF) 100 MCG/2ML IJ SOLN
INTRAMUSCULAR | Status: DC | PRN
  Administered 2015-03-19 (×2): 50 ug via INTRAVENOUS
  Administered 2015-03-19: 100 ug via INTRAVENOUS
  Administered 2015-03-19 (×5): 50 ug via INTRAVENOUS

## 2015-03-19 SURGICAL SUPPLY — 91 items
BLADE EXTENDED COATED 6.5IN (ELECTRODE) IMPLANT
CANNULA REDUC XI 12-8 STAPL (CANNULA) ×1
CANNULA REDUC XI 12-8MM STAPL (CANNULA) ×1
CANNULA REDUCER 12-8 DVNC XI (CANNULA) ×1 IMPLANT
CELLS DAT CNTRL 66122 CELL SVR (MISCELLANEOUS) IMPLANT
CLIP LIGATING HEM O LOK PURPLE (MISCELLANEOUS) IMPLANT
CLIP LIGATING HEMOLOK MED (MISCELLANEOUS) IMPLANT
COUNTER NEEDLE 20 DBL MAG RED (NEEDLE) ×3 IMPLANT
COVER MAYO STAND STRL (DRAPES) ×6 IMPLANT
COVER SURGICAL LIGHT HANDLE (MISCELLANEOUS) ×3 IMPLANT
COVER TIP SHEARS 8 DVNC (MISCELLANEOUS) ×1 IMPLANT
COVER TIP SHEARS 8MM DA VINCI (MISCELLANEOUS) ×2
DECANTER SPIKE VIAL GLASS SM (MISCELLANEOUS) ×3 IMPLANT
DEVICE TROCAR PUNCTURE CLOSURE (ENDOMECHANICALS) IMPLANT
DRAPE ARM DVNC X/XI (DISPOSABLE) ×4 IMPLANT
DRAPE COLUMN DVNC XI (DISPOSABLE) ×1 IMPLANT
DRAPE DA VINCI XI ARM (DISPOSABLE) ×8
DRAPE DA VINCI XI COLUMN (DISPOSABLE) ×2
DRAPE SURG IRRIG POUCH 19X23 (DRAPES) ×3 IMPLANT
DRSG OPSITE POSTOP 4X10 (GAUZE/BANDAGES/DRESSINGS) IMPLANT
DRSG OPSITE POSTOP 4X6 (GAUZE/BANDAGES/DRESSINGS) IMPLANT
DRSG OPSITE POSTOP 4X8 (GAUZE/BANDAGES/DRESSINGS) IMPLANT
DRSG PAD ABDOMINAL 8X10 ST (GAUZE/BANDAGES/DRESSINGS) ×3 IMPLANT
ELECT PENCIL ROCKER SW 15FT (MISCELLANEOUS) ×6 IMPLANT
ELECT REM PT RETURN 15FT ADLT (MISCELLANEOUS) ×3 IMPLANT
ENDOLOOP SUT PDS II  0 18 (SUTURE)
ENDOLOOP SUT PDS II 0 18 (SUTURE) IMPLANT
EVACUATOR SILICONE 100CC (DRAIN) IMPLANT
GAUZE SPONGE 4X4 12PLY STRL (GAUZE/BANDAGES/DRESSINGS) ×3 IMPLANT
GLOVE BIO SURGEON STRL SZ 6.5 (GLOVE) ×8 IMPLANT
GLOVE BIO SURGEONS STRL SZ 6.5 (GLOVE) ×4
GLOVE BIOGEL PI IND STRL 7.0 (GLOVE) ×4 IMPLANT
GLOVE BIOGEL PI INDICATOR 7.0 (GLOVE) ×8
GOWN STRL REUS W/TWL 2XL LVL3 (GOWN DISPOSABLE) ×12 IMPLANT
GOWN STRL REUS W/TWL XL LVL3 (GOWN DISPOSABLE) ×18 IMPLANT
HOLDER FOLEY CATH W/STRAP (MISCELLANEOUS) ×3 IMPLANT
LEGGING LITHOTOMY PAIR STRL (DRAPES) ×3 IMPLANT
LIQUID BAND (GAUZE/BANDAGES/DRESSINGS) ×3 IMPLANT
NEEDLE INSUFFLATION 14GA 120MM (NEEDLE) ×3 IMPLANT
PACK CARDIOVASCULAR III (CUSTOM PROCEDURE TRAY) ×3 IMPLANT
PACK COLON (CUSTOM PROCEDURE TRAY) ×3 IMPLANT
PEN SKIN MARKING BROAD (MISCELLANEOUS) ×3 IMPLANT
PORT LAP GEL ALEXIS MED 5-9CM (MISCELLANEOUS) IMPLANT
RTRCTR WOUND ALEXIS 18CM MED (MISCELLANEOUS)
SCISSORS LAP 5X35 DISP (ENDOMECHANICALS) ×3 IMPLANT
SEAL CANN UNIV 5-8 DVNC XI (MISCELLANEOUS) ×5 IMPLANT
SEAL XI 5MM-8MM UNIVERSAL (MISCELLANEOUS) ×10
SEALER VESSEL DA VINCI XI (MISCELLANEOUS) ×2
SEALER VESSEL EXT DVNC XI (MISCELLANEOUS) ×1 IMPLANT
SET IRRIG TUBING LAPAROSCOPIC (IRRIGATION / IRRIGATOR) ×3 IMPLANT
SLEEVE SURGEON STRL (DRAPES) ×3 IMPLANT
SLEEVE XCEL OPT CAN 5 100 (ENDOMECHANICALS) IMPLANT
SOLUTION ELECTROLUBE (MISCELLANEOUS) ×3 IMPLANT
STAPLER 45 BLU RELOAD XI (STAPLE) IMPLANT
STAPLER 45 BLUE RELOAD XI (STAPLE)
STAPLER 45 GREEN RELOAD XI (STAPLE)
STAPLER 45 GRN RELOAD XI (STAPLE) IMPLANT
STAPLER CANNULA SEAL DVNC XI (STAPLE) ×1 IMPLANT
STAPLER CANNULA SEAL XI (STAPLE) ×2
STAPLER PROXIMATE 75MM BLUE (STAPLE) ×3 IMPLANT
STAPLER SHEATH (SHEATH) ×2
STAPLER SHEATH ENDOWRIST DVNC (SHEATH) ×1 IMPLANT
STAPLER VISISTAT 35W (STAPLE) ×3 IMPLANT
SUT PDS AB 1 CTX 36 (SUTURE) IMPLANT
SUT PDS AB 1 TP1 96 (SUTURE) IMPLANT
SUT PROLENE 2 0 KS (SUTURE) ×3 IMPLANT
SUT SILK 2 0 (SUTURE) ×2
SUT SILK 2 0 SH CR/8 (SUTURE) ×3 IMPLANT
SUT SILK 2-0 18XBRD TIE 12 (SUTURE) ×1 IMPLANT
SUT SILK 3 0 (SUTURE) ×2
SUT SILK 3 0 SH CR/8 (SUTURE) ×3 IMPLANT
SUT SILK 3-0 18XBRD TIE 12 (SUTURE) ×1 IMPLANT
SUT V-LOC BARB 180 2/0GR6 GS22 (SUTURE)
SUT VIC AB 2-0 SH 18 (SUTURE) ×18 IMPLANT
SUT VIC AB 2-0 SH 27 (SUTURE) ×2
SUT VIC AB 2-0 SH 27X BRD (SUTURE) ×1 IMPLANT
SUT VIC AB 3-0 SH 18 (SUTURE) IMPLANT
SUT VIC AB 4-0 PS2 27 (SUTURE) ×6 IMPLANT
SUT VLOC 180 0 6IN GS21 (SUTURE) ×6 IMPLANT
SUTURE V-LC BRB 180 2/0GR6GS22 (SUTURE) IMPLANT
SYRINGE 10CC LL (SYRINGE) ×3 IMPLANT
SYS LAPSCP GELPORT 120MM (MISCELLANEOUS)
SYSTEM LAPSCP GELPORT 120MM (MISCELLANEOUS) IMPLANT
TOWEL OR 17X26 10 PK STRL BLUE (TOWEL DISPOSABLE) ×3 IMPLANT
TOWEL OR NON WOVEN STRL DISP B (DISPOSABLE) ×3 IMPLANT
TRAY FOLEY W/METER SILVER 14FR (SET/KITS/TRAYS/PACK) IMPLANT
TRAY FOLEY W/METER SILVER 16FR (SET/KITS/TRAYS/PACK) ×6 IMPLANT
TROCAR BLADELESS OPT 5 100 (ENDOMECHANICALS) ×3 IMPLANT
TUBING CONNECTING 10 (TUBING) ×2 IMPLANT
TUBING CONNECTING 10' (TUBING) ×1
TUBING FILTER THERMOFLATOR (ELECTROSURGICAL) ×3 IMPLANT

## 2015-03-19 NOTE — Anesthesia Procedure Notes (Signed)
Procedure Name: Intubation Date/Time: 03/19/2015 12:06 PM Performed by: Mellanie Bejarano, Virgel Gess Pre-anesthesia Checklist: Patient identified, Emergency Drugs available, Suction available, Patient being monitored and Timeout performed Patient Re-evaluated:Patient Re-evaluated prior to inductionOxygen Delivery Method: Circle system utilized Preoxygenation: Pre-oxygenation with 100% oxygen Intubation Type: IV induction Ventilation: Mask ventilation without difficulty Laryngoscope Size: Mac and 4 Grade View: Grade II Tube type: Oral Tube size: 7.5 mm Number of attempts: 1 Airway Equipment and Method: Stylet Placement Confirmation: ETT inserted through vocal cords under direct vision,  positive ETCO2,  CO2 detector and breath sounds checked- equal and bilateral Secured at: 22 cm Tube secured with: Tape Dental Injury: Teeth and Oropharynx as per pre-operative assessment

## 2015-03-19 NOTE — H&P (View-Only) (Signed)
Brad Sanchez. Brad Sanchez 02/19/2015 9:52 AM Location: [facility] Patient #: [id number] DOB: May 01, 1935 Widowed / Language: Cleophus Sanchez / Race: White Male History of Present Illness [name] MD; 6/54/6503 10:21 AM) Patient words: New-rectal cancer.  The patient is a 80 year old male who presents with colorectal cancer. This is a 80 year old male who was recently diagnosed with rectal cancer. He underwent a colonoscopy in June 2016 showed a large distal rectal mass with noncritical luminal narrowing. Biopsy was consistent with adenocarcinoma. CT scan showed no other signs of metastatic disease. EUS showed a T3 N1 (stage IIIB) rectal adenocarcinoma with the distal edge located approximately 1 cm from the internal anal verge. He has recently completed chemotherapy and radiation. He had some skin breakdown with radiation but has since recovered. He is currently having bowel movements every day with no bleeding. Other Problems [name], CMA; 02/19/2015 9:54 AM) Heart murmur Rectal Cancer  Past Surgical History [name], CMA; 02/19/2015 9:54 AM) No pertinent past surgical history  Diagnostic Studies History [name], CMA; 02/19/2015 9:54 AM) Colonoscopy within last year  Allergies [name], CMA; 02/19/2015 9:53 AM) No Known Drug Allergies 02/19/2015  Medication History [name], CMA; 02/19/2015 9:54 AM) Tylenol (325MG  Tablet, Oral) Active. LORazepam (1MG  Tablet, Oral) Active. Crestor (20MG  Tablet, Oral) Active.  Social History [name], CMA; 02/19/2015 9:54 AM) Caffeine use Carbonated beverages, Coffee, Tea. No alcohol use No drug use Tobacco use Former smoker.  Family History [name], CMA; 02/19/2015 9:54 AM) First Degree Relatives No pertinent family history     Review of Systems [name] CMA; 02/19/2015 9:54 AM) General Not Present- Appetite Loss, Chills, Fatigue, Fever, Night Sweats, Weight Gain and Weight  Loss. Skin Not Present- Change in Wart/Mole, Dryness, Hives, Jaundice, New Lesions, Non-Healing Wounds, Rash and Ulcer. HEENT Not Present- Earache, Hearing Loss, Hoarseness, Nose Bleed, Oral Ulcers, Ringing in the Ears, Seasonal Allergies, Sinus Pain, Sore Throat, Visual Disturbances, Wears glasses/contact lenses and Yellow Eyes. Respiratory Not Present- Bloody sputum, Chronic Cough, Difficulty Breathing, Snoring and Wheezing. Breast Not Present- Breast Mass, Breast Pain, Nipple Discharge and Skin Changes. Cardiovascular Not Present- Chest Pain, Difficulty Breathing Lying Down, Leg Cramps, Palpitations, Rapid Heart Rate, Shortness of Breath and Swelling of Extremities. Gastrointestinal Not Present- Abdominal Pain, Bloating, Bloody Stool, Change in Bowel Habits, Chronic diarrhea, Constipation, Difficulty Swallowing, Excessive gas, Gets full quickly at meals, Hemorrhoids, Indigestion, Nausea, Rectal Pain and Vomiting. Male Genitourinary Not Present- Blood in Urine, Change in Urinary Stream, Frequency, Impotence, Nocturia, Painful Urination, Urgency and Urine Leakage.  Vitals (Chemira Jones CMA; 02/19/2015 9:53 AM) 02/19/2015 9:52 AM Weight: 138.4 lb Height: 66in Body Surface Area: 1.71 m Body Mass Index: 22.34 kg/m Temp.: 97.58F(Oral)  Pulse: 64 (Regular)  BP: 120/70 (Sitting, Left Arm, Standard)     Physical Exam [name] MD; 5/46/5681 10:22 AM)  General Mental Status-Alert. General Appearance-Consistent with stated age. Hydration-Well hydrated. Voice-Normal.  Head and Neck Head-normocephalic, atraumatic with no lesions or palpable masses. Trachea-midline. Thyroid Gland Characteristics - normal size and consistency.  Eye Eyeball - Bilateral-Extraocular movements intact. Sclera/Conjunctiva - Bilateral-No scleral icterus.  Chest and Lung Exam Chest and lung exam reveals -quiet, even and easy respiratory effort with no use of accessory muscles  and on auscultation, normal breath sounds, no adventitious sounds and normal vocal resonance. Inspection Chest Wall - Normal. Back - normal.  Cardiovascular Cardiovascular examination reveals -normal heart sounds, regular rate and rhythm with no murmurs and normal pedal pulses bilaterally.  Abdomen Inspection Inspection of  the abdomen reveals - No Hernias. Palpation/Percussion Palpation and Percussion of the abdomen reveal - Soft, Non Tender, No Rebound tenderness, No Rigidity (guarding) and No hepatosplenomegaly. Auscultation Auscultation of the abdomen reveals - Bowel sounds normal.  Rectal Anorectal Exam External - normal external exam. Internal - Note: Mass palpated anteriorly invading into the proximal anal sphincter.  Neurologic Neurologic evaluation reveals -alert and oriented x 3 with no impairment of recent or remote memory. Mental Status-Normal.  Musculoskeletal Global Assessment -Note:no gross deformities.  Normal Exam - Left-Upper Extremity Strength Normal and Lower Extremity Strength Normal. Normal Exam - Right-Upper Extremity Strength Normal and Lower Extremity Strength Normal.    Assessment & Plan [name] MD; 1/85/6314 10:37 AM)  PRIMARY CANCER OF RECTUM (C20) Impression: 80 year old male who presents to the office with rectal cancer being treated with neoadjuvant chemotherapy and radiation. He completed this at the end of August. He is here today for surgical consultation. CT scan showed no evidence of metastatic disease. His endoscopic ultrasound showed a T3 N1 lesion. His CEA level 3 months ago was 0.9. On exam today there is tumor noted invading into the proximal anal canal. This would preclude him from any sphincter preserving surgery. He will need an APR. I would like to get a MRI of his pelvis in early October to evaluate the proximity of the tumor to the prostate. We will have him do a bowel prep the night before surgery. I will have him  marked by the ostomy nurse prior to surgery. I will plan on doing an open abdominal peroneal resection. The surgery and anatomy were described to the patient as well as the risks of surgery and the possible complications. These include: Bleeding, deep abdominal infections and possible wound complications such as hernia and infection, damage to adjacent structures, leak of surgical connections, which can lead to other surgeries and possibly an ostomy, possible need for other procedures, such as abscess drains in radiology, possible prolonged hospital stay, possible diarrhea from removal of part of the colon, possible constipation from narcotics, possible bowel, bladder or sexual dysfunction if having rectal surgery, prolonged fatigue/weakness or appetite loss, possible early recurrence of of disease, possible complications of their medical problems such as heart disease or arrhythmias or lung problems, death (less than 1%). I believe the patient understands and wishes to proceed with the surgery.

## 2015-03-19 NOTE — Op Note (Signed)
03/19/2015  4:49 PM  PATIENT:  Brad Sanchez  80 y.o. male  Patient Care Team: Marjean Donna, MD as PCP - General (Family Medicine)  PRE-OPERATIVE DIAGNOSIS:  distal rectal cancer  POST-OPERATIVE DIAGNOSIS:  distal rectal cancer  PROCEDURE: XI ROBOTIC ASSISTED ABDOMINAL PERITONEAL RESECTION    Surgeon(s): [name], MD Michael Boston, MD  ASSISTANT: Dr Johney Maine   ANESTHESIA:   local and general  EBL:  Total I/O In: 2200 [I.V.:2200] Out: 605 [Urine:355; Blood:250]  Delay start of Pharmacological VTE agent (>24hrs) due to surgical blood loss or risk of bleeding:  no  DRAINS: (19F) Jackson-Pratt drain(s) with closed bulb suction in the pelvis   SPECIMEN:  Source of Specimen:  Rectum and anus, additional proximal margin  DISPOSITION OF SPECIMEN:  PATHOLOGY  COUNTS:  YES  PLAN OF CARE: Admit to inpatient   PATIENT DISPOSITION:  PACU - hemodynamically stable.  INDICATION:    80 y.o. M with a distal rectal cancer.  I recommended segmental resection:  The anatomy & physiology of the digestive tract was discussed.  The pathophysiology was discussed.  Natural history risks without surgery was discussed.   I worked to give an overview of the disease and the frequent need to have multispecialty involvement.  I feel the risks of no intervention will lead to serious problems that outweigh the operative risks; therefore, I recommended a partial colectomy to remove the pathology.  Laparoscopic & open techniques were discussed.   Risks such as bleeding, infection, abscess, leak, reoperation, possible ostomy, hernia, heart attack, death, and other risks were discussed.  I noted a good likelihood this will help address the problem.   Goals of post-operative recovery were discussed as well.    The patient expressed understanding & wished to proceed with surgery.  OR FINDINGS:   Patient had mass at the level of his prostate anteriorly.  No obvious metastatic disease on  visceral parietal peritoneum or liver.   DESCRIPTION:   Informed consent was confirmed.  The patient underwent general anaesthesia without difficulty.  The patient was positioned appropriately.  VTE prevention in place.  The patient's abdomen was clipped, prepped, & draped in a sterile fashion.  Surgical timeout confirmed our plan.  The patient was positioned in reverse Trendelenburg.  Abdominal entry was gained using Varies.  Entry was clean.  I induced carbon dioxide insufflation.  Camera inspection revealed no injury.  Extra ports were carefully placed under direct laparoscopic visualization.   I reflected the greater omentum and the upper abdomen the small bowel in the upper abdomen. I scored the base of peritoneum of the right side of the mesentery of the left colon from the ligament of Treitz to the peritoneal reflection of the mid rectum.   I elevated the sigmoid mesentery and enetered into the retro-mesenteric plane. We were able to identify the left ureter and gonadal vessels. We kept those posterior within the retroperitoneum and elevated the left colon mesentery off that. I did isolated IMA pedicle but did not ligate it yet.  I continued distally and got into the avascular plane posterior to the mesorectum. This allowed me to help mobilize the rectum as well by freeing the mesorectum off the sacrum.  I mobilized the peritoneal coverings towards the peritoneal reflection on both the right and left sides of the rectum.  I could see the right and left ureters and stayed away from them.   I skeletonized the inferior mesenteric artery pedicle.  I went down to its takeoff  from the aorta.  After confirming the left ureter was out of the way, I went ahead and ligated the inferior mesenteric artery pedicle with the robotic vessel sealer ~2cm above its takeoff from the aorta.  We ensured hemostasis. I skeletonized the mesorectum at the junction at the proximal rectum using blunt dissection & cautery. I  began to dissect in mesorectum plane posteriorly down to the level of the coccyx. I then dissected out the lateral borders. I then used electrocautery to open up the peritoneal reflection anteriorly. I carefully dissected between the seminal vesicles and the anterior rectum. I was able to get down below this and dissected the prostate off of the anterior rectum as well. After this was completed I continued to divide posteriorly and laterally until the pelvic floor was encountered. I came down through the pelvic floor in a cylindrical fashion.  After this was completed we switched to the perineal dissection. Cautery was used to dissect out the anal canal.  A [place] retractor was placed. I continued to dissect between the anal canal and the issue rectal space posteriorly until I countered the previously dissected mesorectum plane at the level of the coccyx. I then divided the lateral attachments using electrocautery. Lastly I divided the anterior portion of the rectum using my finger to guide the dissection into the correct plane between the rectum and the urethra. Once the specimen was free a GIA blue load stapler was used to transect it and this was sent to pathology for further examination. A lap and a 19 [place] Blake drain were placed into the pelvis and the perineum was closed in layers using interrupted 2-0 Vicryl sutures.  After this was completed, we turned our attention back to the abdomen. The drain was brought out through the right lower quadrant port site. The peritoneum was then closed using the robot and a 2-0 V-lock suture.  After this was completed, we brought out the ostomy in the previously marked left upper quadrant space using electrocautery to remove the skin and subcutaneous tissues. A cruciate incision was placed in the fascia. The rectus muscle was split and the peritoneum was entered with cautery. The colon was then brought out through the ostomy site and secured into place.  Prior to this,  we did remove the lap sponge from the abdomen. We then insufflated the abdomen and evaluated internally. Hemostasis was good. The omentum was brought down to the pelvis. The ostomy was checked and appeared to be in correct position. The abdomen was then desufflated and the ports were removed. The 8 mm port sites were closed using interrupted 4-0 Vicryl suture and Dermabond. The ostomy was then matured in standard [facility] fashion using 3-0 Vicryl sutures. An ostomy appliance was placed. A drain sponge and perineal wound covering was placed. The patient was then awakened from anesthesia and sent to the postanesthesia care unit in stable condition. All counts were correct per operating room staff.

## 2015-03-19 NOTE — Anesthesia Postprocedure Evaluation (Signed)
  Anesthesia Post-op Note  Patient: Brad Sanchez  Procedure(s) Performed: Procedure(s) (LRB): XI ROBOTIC ASSISTED ABDOMINAL PERITONEAL RESECTION (N/A)  Patient Location: PACU  Anesthesia Type: General  Level of Consciousness: awake and alert   Airway and Oxygen Therapy: Patient Spontanous Breathing  Post-op Pain: mild  Post-op Assessment: Post-op Vital signs reviewed, Patient's Cardiovascular Status Stable, Respiratory Function Stable, Patent Airway and No signs of Nausea or vomiting  Last Vitals:  Filed Vitals:   03/19/15 1745  BP: 154/68  Pulse: 74  Temp:   Resp: 15    Post-op Vital Signs: stable   Complications: No apparent anesthesia complications

## 2015-03-19 NOTE — Anesthesia Preprocedure Evaluation (Signed)
Anesthesia Evaluation  Patient identified by MRN, date of birth, ID band Patient awake    Reviewed: Allergy & Precautions, H&P , NPO status , Patient's Chart, lab work & pertinent test results  Airway Mallampati: II  TM Distance: >3 FB Neck ROM: full    Dental  (+) Caps, Dental Advisory Given All upper front capped:   Pulmonary Current Smoker,    Pulmonary exam normal breath sounds clear to auscultation       Cardiovascular Exercise Tolerance: Good negative cardio ROS Normal cardiovascular exam Rhythm:regular Rate:Normal     Neuro/Psych negative neurological ROS  negative psych ROS   GI/Hepatic negative GI ROS, Neg liver ROS,   Endo/Other  negative endocrine ROS  Renal/GU negative Renal ROS  negative genitourinary   Musculoskeletal   Abdominal   Peds  Hematology negative hematology ROS (+)   Anesthesia Other Findings   Reproductive/Obstetrics negative OB ROS                             Anesthesia Physical Anesthesia Plan  ASA: II  Anesthesia Plan: General   Post-op Pain Management:    Induction: Intravenous  Airway Management Planned: Oral ETT  Additional Equipment:   Intra-op Plan:   Post-operative Plan: Extubation in OR  Informed Consent: I have reviewed the patients History and Physical, chart, labs and discussed the procedure including the risks, benefits and alternatives for the proposed anesthesia with the patient or authorized representative who has indicated his/her understanding and acceptance.   Dental Advisory Given  Plan Discussed with: CRNA and Surgeon  Anesthesia Plan Comments:         Anesthesia Quick Evaluation

## 2015-03-19 NOTE — Interval H&P Note (Signed)
History and Physical Interval Note:  03/19/2015 11:05 AM  Brad Sanchez  has presented today for surgery, with the diagnosis of distal rectal cancer  The various methods of treatment have been discussed with the patient and family. After consideration of risks, benefits and other options for treatment, the patient has consented to  Procedure(s): XI ROBOTIC ASSISTED ABDOMINAL PERITONEAL RESECTION (N/A) as a surgical intervention .  The patient's history has been reviewed, patient examined, no change in status, stable for surgery.  I have reviewed the patient's chart and labs.  Questions were answered to the patient's satisfaction.    After evaluating the MRI, I felt that a robotic approach would be more assistive in taking down the anterior margin.  The patient is aware of this and has agreed to change in procedure.  All questions were answered.    Rosario Adie, MD  [facility]

## 2015-03-19 NOTE — Transfer of Care (Signed)
Immediate Anesthesia Transfer of Care Note  Patient: Brad Sanchez  Procedure(s) Performed: Procedure(s): XI ROBOTIC ASSISTED ABDOMINAL PERITONEAL RESECTION (N/A)  Patient Location: PACU  Anesthesia Type:General  Level of Consciousness: awake, alert , oriented and patient cooperative  Airway & Oxygen Therapy: Patient Spontanous Breathing and Patient connected to face mask oxygen  Post-op Assessment: Report given to RN, Post -op Vital signs reviewed and stable and Patient moving all extremities X 4  Post vital signs: stable  Last Vitals:  Filed Vitals:   03/19/15 1706  BP: 191/86  Pulse: 77  Temp: 36.6 C  Resp: 10    Complications: No apparent anesthesia complications

## 2015-03-20 ENCOUNTER — Encounter (HOSPITAL_COMMUNITY): Payer: Self-pay | Admitting: General Surgery

## 2015-03-20 LAB — BASIC METABOLIC PANEL
ANION GAP: 7 (ref 5–15)
BUN: 15 mg/dL (ref 6–20)
CALCIUM: 7.9 mg/dL — AB (ref 8.9–10.3)
CO2: 25 mmol/L (ref 22–32)
CREATININE: 0.97 mg/dL (ref 0.61–1.24)
Chloride: 104 mmol/L (ref 101–111)
GLUCOSE: 178 mg/dL — AB (ref 65–99)
POTASSIUM: 4.7 mmol/L (ref 3.5–5.1)
SODIUM: 136 mmol/L (ref 135–145)

## 2015-03-20 LAB — CBC
HEMATOCRIT: 25.9 % — AB (ref 39.0–52.0)
Hemoglobin: 8.5 g/dL — ABNORMAL LOW (ref 13.0–17.0)
MCH: 27.9 pg (ref 26.0–34.0)
MCHC: 32.8 g/dL (ref 30.0–36.0)
MCV: 84.9 fL (ref 78.0–100.0)
Platelets: 160 10*3/uL (ref 150–400)
RBC: 3.05 MIL/uL — ABNORMAL LOW (ref 4.22–5.81)
RDW: 15.1 % (ref 11.5–15.5)
WBC: 9.6 10*3/uL (ref 4.0–10.5)

## 2015-03-20 NOTE — Evaluation (Signed)
Physical Therapy Evaluation Patient Details Name: Brad Sanchez MRN: [id number] DOB: 1935-03-23 Today's Date: 03/20/2015   History of Present Illness  80 yo male adm with rectal mass/CA--> s/p ROBOTIC ASSISTED ABDOMINAL PERITONEAL RESECTION  Clinical Impression  Pt admitted with above diagnosis. Pt currently with functional limitations due to the deficits listed below (see PT Problem List).  Pt will benefit from skilled PT to increase their independence and safety with mobility to allow discharge to the venue listed below.   Pt should progress well; was quite independent prior to adm, recommending HHPt at this time d/t decr caregiver support       Follow Up Recommendations Home health PT    Equipment Recommendations  Other (comment) (TBA--hopefully will not need RW )    Recommendations for Other Services       Precautions / Restrictions        Mobility  Bed Mobility               General bed mobility comments: NT-in chair  Transfers Overall transfer level: Needs assistance Equipment used: Rolling walker (2 wheeled) Transfers: Sit to/from Stand Sit to Stand: Min guard         General transfer comment: cues for safety and hand placement  Ambulation/Gait Ambulation/Gait assistance: Min guard;Min assist Ambulation Distance (Feet): 380 Feet Assistive device: Rolling walker (2 wheeled) Gait Pattern/deviations: Step-through pattern;Decreased stride length;Trunk flexed     General Gait Details: multi-modal cues for RW position and safety, occasional assist with maneuvering; pt reports dizziness when amb NT earlier and some dizziness at this time, unchanged during session so likely d/t meds  Stairs            Wheelchair Mobility    Modified Rankin (Stroke Patients Only)       Balance Overall balance assessment: Needs assistance           Standing balance-Leahy Scale: Fair                               Pertinent Vitals/Pain  Pain Assessment: 0-10 Pain Score: 5  Pain Location: lower abd Pain Descriptors / Indicators: Sore;Tender Pain Intervention(s): Limited activity within patient's tolerance;Monitored during session;Premedicated before session;PCA encouraged    Home Living Family/patient expects to be discharged to:: Private residence Living Arrangements: Alone   Type of Home: House         Home Equipment: None Additional Comments: son checks in with him daily (often via phone as pt was independent prior to adm)    Prior Function Level of Independence: Independent               Hand Dominance        Extremity/Trunk Assessment   Upper Extremity Assessment: Overall WFL for tasks assessed           Lower Extremity Assessment: Overall WFL for tasks assessed         Communication   Communication: No difficulties  Cognition Arousal/Alertness: Awake/alert Behavior During Therapy: WFL for tasks assessed/performed Overall Cognitive Status: Within Functional Limits for tasks assessed                      General Comments      Exercises        Assessment/Plan    PT Assessment Patient needs continued PT services  PT Diagnosis Difficulty walking   PT Problem List Decreased mobility;Pain;Decreased knowledge of use of DME  PT Treatment Interventions DME instruction;Gait training;Functional mobility training;Therapeutic activities;Therapeutic exercise;Patient/family education   PT Goals (Current goals can be found in the Care Plan section) Acute Rehab PT Goals Patient Stated Goal: to get home, be I again PT Goal Formulation: With patient Time For Goal Achievement: 03/27/15 Potential to Achieve Goals: Good    Frequency Min 3X/week   Barriers to discharge        Co-evaluation               End of Session Equipment Utilized During Treatment: Gait belt Activity Tolerance: Patient tolerated treatment well Patient left: in chair;with call bell/phone within  reach Nurse Communication: Mobility status         Time: 1225-1244 PT Time Calculation (min) (ACUTE ONLY): 19 min   Charges:   PT Evaluation $Initial PT Evaluation Tier I: 1 Procedure     PT G Codes:        Brad Sanchez 04/05/15, 1:10 PM

## 2015-03-20 NOTE — Progress Notes (Signed)
1 Day Post-Op Robotic Assisted APR Subjective: Patient doing well.  Pain controlled.  No nausea.  Just "feels sore"  Objective: Vital signs in last 24 hours: Temp:  [97.3 F (36.3 C)-99 F (37.2 C)] 98.9 F (37.2 C) (10/13 0514) Pulse Rate:  [64-93] 93 (10/13 0514) Resp:  [10-20] 14 (10/13 0757) BP: (114-191)/(56-86) 114/60 mmHg (10/13 0514) SpO2:  [98 %-100 %] 100 % (10/13 0757) Weight:  [66.225 kg (146 lb)] 66.225 kg (146 lb) (10/12 0955)   Intake/Output from previous day: 10/12 0701 - 10/13 0700 In: 4568.3 [I.V.:4568.3] Out: 1730 [Urine:1245; Drains:235; Blood:250] Intake/Output this shift:     General appearance: alert and cooperative GI: soft, non-distended Ostomy: beefy red JP: serosanguinous drainage noted Incision: no significant drainage  Lab Results:   Recent Labs  03/17/15 1524 03/20/15 0434  WBC 5.4 9.6  HGB 11.2* 8.5*  HCT 36.0* 25.9*  PLT 147* 160   BMET  Recent Labs  03/17/15 1524 03/20/15 0434  NA 139 136  K 3.7 4.7  CL 108 104  CO2 26 25  GLUCOSE 84 178*  BUN 16 15  CREATININE 0.84 0.97  CALCIUM 8.8* 7.9*   PT/INR No results for input(s): LABPROT, INR in the last 72 hours. ABG No results for input(s): PHART, HCO3 in the last 72 hours.  Invalid input(s): PCO2, PO2  MEDS, Scheduled . acetaminophen  1,000 mg Oral 4 times per day  . alvimopan  12 mg Oral BID  . antiseptic oral rinse  7 mL Mouth Rinse q12n4p  . chlorhexidine  15 mL Mouth Rinse BID  . enoxaparin (LOVENOX) injection  40 mg Subcutaneous Q24H  . morphine   Intravenous 6 times per day    Studies/Results: No results found.  Assessment: s/p Procedure(s): XI ROBOTIC ASSISTED ABDOMINAL PERITONEAL RESECTION Patient Active Problem List   Diagnosis Date Noted  . Rectal cancer ([place]) 12/02/2014  . Rotator cuff syndrome of right shoulder 02/04/2011   Expected post op course  Plan: Advance diet to clears Decrease MIV Ambulate Change perineal dressing daily and  PRN   LOS: 1 day     .Rosario Adie, MD [facility], [place]   03/20/2015 8:05 AM

## 2015-03-21 LAB — BASIC METABOLIC PANEL
Anion gap: 3 — ABNORMAL LOW (ref 5–15)
BUN: 12 mg/dL (ref 6–20)
CALCIUM: 8.1 mg/dL — AB (ref 8.9–10.3)
CO2: 29 mmol/L (ref 22–32)
CREATININE: 0.87 mg/dL (ref 0.61–1.24)
Chloride: 103 mmol/L (ref 101–111)
GFR calc non Af Amer: >60 mL/min (ref >60)
Glucose, Bld: 118 mg/dL — ABNORMAL HIGH (ref 65–99)
Potassium: 4.4 mmol/L (ref 3.5–5.1)
Sodium: 135 mmol/L (ref 135–145)

## 2015-03-21 LAB — CBC
HCT: 22.6 % — ABNORMAL LOW (ref 39.0–52.0)
Hemoglobin: 7.3 g/dL — ABNORMAL LOW (ref 13.0–17.0)
MCH: 27.9 pg (ref 26.0–34.0)
MCHC: 32.3 g/dL (ref 30.0–36.0)
MCV: 86.3 fL (ref 78.0–100.0)
PLATELETS: 139 10*3/uL — AB (ref 150–400)
RBC: 2.62 MIL/uL — AB (ref 4.22–5.81)
RDW: 15.5 % (ref 11.5–15.5)
WBC: 8.4 10*3/uL (ref 4.0–10.5)

## 2015-03-21 MED ORDER — OXYCODONE-ACETAMINOPHEN 5-325 MG PO TABS
1.0000 | ORAL_TABLET | ORAL | Status: DC | PRN
  Administered 2015-03-21 – 2015-03-24 (×2): 1 via ORAL
  Filled 2015-03-21: qty 1
  Filled 2015-03-21: qty 2

## 2015-03-21 MED ORDER — MORPHINE SULFATE (PF) 2 MG/ML IV SOLN: 2.0000 mg | INTRAVENOUS | Status: DC | PRN

## 2015-03-21 NOTE — Progress Notes (Addendum)
2 Days Post-Op Robotic Assisted APR Subjective: Patient doing well.  Pain controlled.  No nausea.  Just feels sore.  Tolerating clears  Objective: Vital signs in last 24 hours: Temp:  [97.9 F (36.6 C)-98.4 F (36.9 C)] 98 F (36.7 C) (10/14 0515) Pulse Rate:  [79-91] 89 (10/14 0515) Resp:  [14-19] 16 (10/14 0742) BP: (98-125)/(47-71) 98/47 mmHg (10/14 0515) SpO2:  [97 %-100 %] 100 % (10/14 0742)   Intake/Output from previous day: 10/13 0701 - 10/14 0700 In: 2391.7 [P.O.:840; I.V.:1551.7] Out: 3370 [Urine:3150; Drains:205; Stool:15] Intake/Output this shift:     General appearance: alert and cooperative GI: soft, non-distended Ostomy: pink, air in bag JP: serosanguinous drainage noted Perineal Incision:min bloody drainage  Lab Results:   Recent Labs  03/20/15 0434 03/21/15 0525  WBC 9.6 8.4  HGB 8.5* 7.3*  HCT 25.9* 22.6*  PLT 160 139*   BMET  Recent Labs  03/20/15 0434 03/21/15 0525  NA 136 135  K 4.7 4.4  CL 104 103  CO2 25 29  GLUCOSE 178* 118*  BUN 15 12  CREATININE 0.97 0.87  CALCIUM 7.9* 8.1*   PT/INR No results for input(s): LABPROT, INR in the last 72 hours. ABG No results for input(s): PHART, HCO3 in the last 72 hours.  Invalid input(s): PCO2, PO2  MEDS, Scheduled . alvimopan  12 mg Oral BID  . antiseptic oral rinse  7 mL Mouth Rinse q12n4p  . chlorhexidine  15 mL Mouth Rinse BID  . enoxaparin (LOVENOX) injection  40 mg Subcutaneous Q24H  . morphine   Intravenous 6 times per day    Studies/Results: No results found.  Assessment: s/p Procedure(s): XI ROBOTIC ASSISTED ABDOMINAL PERITONEAL RESECTION Patient Active Problem List   Diagnosis Date Noted  . Rectal cancer ([place]) 12/02/2014  . Rotator cuff syndrome of right shoulder 02/04/2011   Expected post op course  Plan: Advance diet to soft foods Ostomy education SL MIV Ambulate Foley out on Sun. Due to difficult dissection around urethra and prostate as well as patient's  home symptoms of urinary retention, I will leave foley in place to allow this area to heal and periprostatic nerves to begin to function again.    Change perineal dressing daily and PRN   LOS: 2 days     .Rosario Adie, [facility], [place]   03/21/2015 8:27 AM

## 2015-03-21 NOTE — Discharge Instructions (Addendum)
SURGERY: POST OP INSTRUCTIONS °(Surgery for small bowel obstruction, colon resection, etc)  ° °1. DIET: Follow a light bland diet the first 24 hours after arrival home, such as soup, liquids, crackers, etc.  Be sure to include lots of fluids daily.  Avoid fast food or heavy meals as your are more likely to get nauseated.  Stay on a low fat diet the next few days after surgery.  Gradually add a fiber supplement to your diet over the next week.   Your should try to eat a low-fat, high fiber diet the rest of your life thereafter (See Below). °  °2. Take your usually prescribed home medications unless otherwise directed.  OK to take aspirin.    If you are on strong blood thinners (warfarin/Coumadin, Plavix, Xerelto, Eliquis, etc), discuss with your surgeon, medicine PCP, and/or cardiologist for instructions on when to restart the blood thinner & if blood monitoring is needed (PT/INR blood check, etc).  Usually you can restart any strong blood thinners after the second postoperative day. ° °3. PAIN CONTROL: ° °Pain after surgery or related to activity is often due to strain/injury to muscle, tendon, nerves and/or incisions.  This pain is usually short-term and will improve in a few months.  ° °Many people find it helpful to do the following things TOGETHER to help speed the process of healing and to get back to regular activity more quickly: ° °1. Avoid heavy physical activity at first °a. No lifting greater than 20 pounds at first, then increase to lifting as tolerated over the next few weeks °b. Do not “push through” the pain.  Listen to your body and avoid positions and maneuvers than reproduce the pain.  Wait a few days before trying something more intense °c. Walking is okay as tolerated, but go slowly and stop when getting sore.  If you can walk 30 minutes without stopping or pain, you can try more intense activity (running, jogging, aerobics, cycling, swimming, treadmill, sex, sports, weightlifting, etc  ) °d. Remember: If it hurts to do it, then don’t do it! ° °2. Take Anti-inflammatory medication °a. Choose ONE of the following over-the-counter medications: °i.            Acetaminophen 500mg tabs (Tylenol) 1-2 pills with every meal and just before bedtime (avoid if you have liver problems) °ii.            Naproxen 220mg tabs (ex. Aleve) 1-2 pills twice a day (avoid if you have kidney, stomach, IBD, or bleeding problems) °iii. Ibuprofen 200mg tabs (ex. Advil, Motrin) 3-4 pills with every meal and just before bedtime (avoid if you have kidney, stomach, IBD, or bleeding problems) °b. Take with food/snack around the clock for 1-2 weeks °i. This helps the muscle and nerve tissues become less irritable and calm down faster ° °3. Use a Heating pad or Ice/Cold Pack °a. Most patients will experience some swelling and bruising around the incisions.  Swelling and bruising can take several weeks to resolve. °i. Ice packs or heating pads (30-60 minutes up to 6 times a day) will help. °ii. Use ice for the first few days to help decrease swelling and bruising °iii. Switch to heat to help relax tight/sore spots and speed recovery.  °iv. Some people prefer to use ice alone, heat alone, alternating between ice & heat.  Experiment to what works for you °a. May use warm bath/hottub  or showers ° °4. Try Gentle Massage and/or Stretching  °a. at the area of   pain many times a day °b. stop if you feel pain - do not overdo it °5. Prescription for pain medication (such as oxycodone, hydrocodone, etc) should be given to you upon discharge.  Take your pain medication as prescribed. °a. If you are having problems/concerns with the prescription medicine (does not control pain, nausea, vomiting, rash, itching, etc), please call us [phone] to see if we need to switch you to a different pain medicine that will work better for you and/or control your side effect better. °b. If you need a refill on your pain medication, please contact your  pharmacy.  They will contact our office to request authorization. Prescriptions will not be filled after 5 pm or on week-ends. °c.  °Try these steps together to help you body heal faster and avoid making things get worse.  Doing just one of these things may not be enough.   ° °If you are not getting better after two weeks or are noticing you are getting worse, contact our office for further advice; we may need to re-evaluate you & see what other things we can do to help. ° ° °GETTING TO GOOD BOWEL HEALTH. °Irregular bowel habits such as constipation and diarrhea can lead to many problems over time.  Having one soft bowel movement a day is the most important way to prevent further problems.  The anorectal canal is designed to handle stretching and feces to safely manage our ability to get rid of solid waste (feces, poop, stool) out of our body.  BUT, hard constipated stools can act like ripping concrete bricks and diarrhea can be a burning fire to this very sensitive area of our body, causing inflamed hemorrhoids, anal fissures, increasing risk is perirectal abscesses, abdominal pain/bloating, an making irritable bowel worse.     ° °The goal: ONE SOFT BOWEL MOVEMENT A DAY!  To have soft, regular bowel movements:  °• Drink plenty of fluids, consider 4-6 tall glasses of water a day.   °• Take plenty of fiber.  Fiber is the undigested part of plant food that passes into the colon, acting s “natures broom” to encourage bowel motility and movement.  Fiber can absorb and hold large amounts of water. This results in a larger, bulkier stool, which is soft and easier to pass. Work gradually over several weeks up to 6 servings a day of fiber (25g a day even more if needed) in the form of: °o Vegetables -- Root (potatoes, carrots, turnips), leafy green (lettuce, salad greens, celery, spinach), or cooked high residue (cabbage, broccoli, etc) °o Fruit -- Fresh (unpeeled skin & pulp), Dried (prunes, apricots, cherries, etc ),  or  stewed ( applesauce)  °o Whole grain breads, pasta, etc (whole wheat)  °o Bran cereals  °• Bulking Agents -- This type of water-retaining fiber generally is easily obtained each day by one of the following:  °o Psyllium bran -- The psyllium plant is remarkable because its ground seeds can retain so much water. This product is available as Metamucil, Konsyl, Effersyllium, Per Diem Fiber, or the less expensive generic preparation in drug and health food stores. Although labeled a laxative, it really is not a laxative.  °o Methylcellulose -- This is another fiber derived from wood which also retains water. It is available as Citrucel. °o Polyethylene Glycol - and “artificial” fiber commonly called Miralax or Glycolax.  It is helpful for people with gassy or bloated feelings with regular fiber °o Flax Seed - a less gassy fiber than   psyllium °• No reading or other relaxing activity while on the toilet. If bowel movements take longer than 5 minutes, you are too constipated °• AVOID CONSTIPATION.  High fiber and water intake usually takes care of this.  Sometimes a laxative is needed to stimulate more frequent bowel movements, but  °• Laxatives are not a good long-term solution as it can wear the colon out.  They can help jump-start bowels if constipated, but should be relied on constantly without discussing with your doctor °o Osmotics (Milk of Magnesia, Fleets phosphosoda, Magnesium citrate, MiraLax, GoLytely) are safer than  °o Stimulants (Senokot, Castor Oil, Dulcolax, Ex Lax)    °o Avoid taking laxatives for more than 7 days in a row. °•  IF SEVERELY CONSTIPATED, try a Bowel Retraining Program: °o Do not use laxatives.  °o Eat a diet high in roughage, such as bran cereals and leafy vegetables.  °o Drink six (6) ounces of prune or apricot juice each morning.  °o Eat two (2) large servings of stewed fruit each day.  °o Take one (1) heaping tablespoon of a psyllium-based bulking agent twice a day. Use sugar-free  sweetener when possible to avoid excessive calories.  °o Eat a normal breakfast.  °o Set aside 15 minutes after breakfast to sit on the toilet, but do not strain to have a bowel movement.  °o If you do not have a bowel movement by the third day, use an enema and repeat the above steps.  °• Controlling diarrhea °o Switch to liquids and simpler foods for a few days to avoid stressing your intestines further. °o Avoid dairy products (especially milk & ice cream) for a short time.  The intestines often can lose the ability to digest lactose when stressed. °o Avoid foods that cause gassiness or bloating.  Typical foods include beans and other legumes, cabbage, broccoli, and dairy foods.  Every person has some sensitivity to other foods, so listen to our body and avoid those foods that trigger problems for you. °o Adding fiber (Citrucel, Metamucil, psyllium, Miralax) gradually can help thicken stools by absorbing excess fluid and retrain the intestines to act more normally.  Slowly increase the dose over a few weeks.  Too much fiber too soon can backfire and cause cramping & bloating. °o Probiotics (such as active yogurt, Align, etc) may help repopulate the intestines and colon with normal bacteria and calm down a sensitive digestive tract.  Most studies show it to be of mild help, though, and such products can be costly. °o Medicines: °- Bismuth subsalicylate (ex. Kayopectate, Pepto Bismol) every 30 minutes for up to 6 doses can help control diarrhea.  Avoid if pregnant. °- Loperamide (Immodium) can slow down diarrhea.  Start with two tablets (4mg total) first and then try one tablet every 6 hours.  Avoid if you are having fevers or severe pain.  If you are not better or start feeling worse, stop all medicines and call your doctor for advice °o Call your doctor if you are getting worse or not better.  Sometimes further testing (cultures, endoscopy, X-ray studies, bloodwork, etc) may be needed to help diagnose and treat  the cause of the diarrhea. ° °TROUBLESHOOTING IRREGULAR BOWELS °1) Avoid extremes of bowel movements (no bad constipation/diarrhea) °2) Miralax 17gm mixed in 8oz. water or juice-daily. May use BID as needed.  °3) Gas-x,Phazyme, etc. as needed for gas & bloating.  °4) Soft,bland diet. No spicy,greasy,fried foods.  °5) Prilosec over-the-counter as needed  °6) May hold   gluten/wheat products from diet to see if symptoms improve.  7)  May try probiotics (Align, Activa, etc) to help calm the bowels down 7) If symptoms become worse call back immediately.   4. Wash / shower every day.  You may shower over the incision / wound.  Avoid baths until 5 days after surgery.  Continue to shower over incision(s) after the dressing is off.  5. Remove your waterproof bandages 5 days after surgery.  You may leave the incision open to air.  Remove any wicks or ribbons in your wound.  If you have an open wound, please see wound care instructions. You may replace a dressing/Band-Aid to cover the incision for comfort if you wish.  6. ACTIVITIES as tolerated:   a. You may resume regular (light) daily activities beginning the next day--such as daily self-care, walking, climbing stairs--gradually increasing activities as tolerated.  If you can walk 30 minutes without difficulty, it is safe to try more intense activity such as jogging, treadmill, bicycling, low-impact aerobics, swimming, etc. b. Save the most intensive and strenuous activity for last (Usually 3-6 weeks after surgery) such as sit-ups, heavy lifting, contact sports, etc  Refrain from any heavy lifting or straining until you are off narcotics for pain control.   c. DO NOT PUSH THROUGH PAIN.  Let pain be your guide: If it hurts to do something, don't do it.  Pain is your body warning you to avoid that activity for another week until the pain goes down. d. You may drive when you are no longer taking prescription pain medication, you can comfortably wear a seatbelt, and  you can safely maneuver your car and apply brakes. e. Dennis Bast may have sexual intercourse when it is comfortable. If it hurts to do something, don't do it.  7. FOLLOW UP in our office a. Please call [facility] at [phone] to set up an appointment to see your surgeon in the office for a follow-up appointment approximately 2-3 weeks after your surgery. b. Make sure that you call for this appointment the day you arrive home to insure a convenient appointment time.  8. IF YOU HAVE DISABILITY OR FAMILY LEAVE FORMS, BRING THEM TO THE OFFICE FOR PROCESSING.  DO NOT GIVE THEM TO YOUR DOCTOR.   WHEN TO CALL us [phone]: 1. Poor pain control 2. Reactions / problems with new medications (rash/itching, nausea, etc)  3. Fever over 101.5 F (38.5 C) 4. Inability to urinate 5. Nausea and/or vomiting 6. Worsening swelling or bruising 7. Continued bleeding from incision. 8. Increased pain, redness, or drainage from the incision  The clinic staff is available to answer your questions during regular business hours (8:30am-5pm).  Please dont hesitate to call and ask to speak to one of our nurses for clinical concerns.   A surgeon from [facility] is always on call at the hospitals   If you have a medical emergency, go to the nearest emergency room or call 911.    [facility], [place], [place], [place], [place]  [address] ? MAIN: [phone] ? TOLL FREE: [phone] ? FAX [phone] [email or web address]     JP Care  The Jackson-Pratt drainage system has flexible tubing attached to a soft, plastic bulb with a stopper. The drainage end of the tubing, which is flat and white, goes into your body through a small opening near your incision (surgical cut). A stitch holds the drainage end in place. The rest of the tube  is outside your body, attached to the bulb. When the bulb is compressed with the stopper in place, it creates a vacuum. This  causes a constant gentle suction, which helps draw out fluid that collects under your incision. The bulb should be compressed at all times, except when you are emptying the drainage.  How long you will have your Jackson-Pratt depends on your surgery and the amount of fluid is draining. This is different for everyone. The Jackson-Pratt is usually removed when the drainage is 30 mL or less over 24 hours. To keep track of how much drainage youre having, you will record the amount in a drainage log. Its important to bring the log with you to your follow-up appointments.  Caring for Your Jackson-Pratt at Home In order to care for your Jackson-Pratt at home, you or your caregiver will do the following:  Empty the drain once a day and record the color and amount of drainage  Care for the area where the tubing enters your skin by washing with soap and water.  Milk the tubing to help move clots into the bulb.  Do this before you empty and measure your drainage. Look in the mirror at the tubing. This will help you see where your hands need to be. Pinch the tubing close to where it goes into your skin between your thumb and forefinger. With the thumb and forefinger of your other hand, pinch the tubing right below your other fingers. Keep your fingers pinched and slide them down the tubing, pushing any clots down toward the bulb. You may want to use alcohol swabs to help you slide your fingers down the tubing. Repeat steps 3 and 4 as necessary to push clots from the tubing into the bulb. If you are not able to move a clot into the bulb, call your doctors office. The fluid may leak around the insertion site if a clot is blocking the drainage flow. If there is fluid in the bulb and no leakage at the insertion site, the drain is working.  How to Empty Your Jackson-Pratt and Record the Drainage You will need to empty your Jackson-Pratt every day  Gather the following supplies:  Measuring container your  nurse gave you Jackson-Pratt Drainage Record  Pen or pencil  Instructions Clean an area to work on. Clean your hands thoroughly. Unplug the stopper on top of your Jackson-Pratt. This will cause the bulb to expand. Do not touch the inside of the stopper or the inner area of the opening on the bulb. Turn your Jackson-Pratt upside down, gently squeeze the bulb, and pour the drainage into the measuring container. Turn your Jackson-Pratt right side up. Squeeze the bulb until your fingers feel the palm of your hand. Keep squeezing the bulb while you replug the stopper. Make sure the bulb stays fully compressed to ensure constant, gentle suction.    Check the amount and color of drainage in the measuring container. The first couple days after surgery the fluid may be dark red. This is normal. As you heal the fluid may look pink or pale yellow. Record this amount and the color of drainage on your Jackson-Pratt Drainage Record. Flush the drainage down the toilet and rinse the measuring container with water.  Caring for the Insertion Site Once you have emptied the drainage, clean your hands again. Check the area around the insertion site. Look for tenderness, swelling, or pus. If you have any of these, or if you have a temperature of 101  F (38.3 C) or higher, you may have an infection. Call your doctors office.  Sometimes, the drain causes redness the size of a dime at your insertion site. This is normal. Your healthcare provider will tell you if you should place a bandage over the insertion site.     Colostomy Care  The following guidelines will make care of your colostomy easier. Keep this information close by for quick reference.  Helpful hints Eat a well-balanced diet including vegetables and fresh fruits. Eat on a regular schedule. Drink at least 6 to 8 glasses of fluids daily. Eat slowly in a relaxed atmosphere. Chew your food thoroughly. Avoid chewing gum, smoking, and drinking from  a straw. This will help decrease the amount of air you swallow, which may help reduce gas. Eating yogurt or drinking buttermilk may help reduce gas. To control gas at night, do not eat after 8 p.m. This will give your bowel time to quiet down before you go to bed. If gas is a problem, you can purchase Beano. Sprinkle Beano on the first bite of food before eating to reduce gas. It has no flavor and should not change the taste of your food. You can buy Beano over the counter at your local drugstore. Foods like fish, onions, garlic, broccoli, asparagus, and cabbage produce odor. Although your pouch is odor-proof, if you eat these foods you may notice a stronger odor when emptying your pouch. If this is a concern, you may want to limit these foods in your diet.  Applying the pouching system To apply your pouch, follow these steps:  Place all your equipment close at hand before removing your pouch. Wash your hands. Stand or sit in front of a mirror. Use the position that works best for you. Remember that you must keep the skin around the stoma wrinkle-free for a good seal. Gently remove the used pouch (1-piece system) or the pouch and old wafer (2-piece system). Empty the pouch into the toilet. Save the closure clip to use again. Wash the stoma itself and the skin around the stoma. Your stoma may bleed a little when being washed. This is normal. Rinse and pat dry. You may use a wash cloth or soft paper towels (like Bounty), mild soap (like Dial, Safeguard, or [place]), and water. Avoid soaps that contain perfumes or lotions. For a new pouch (1-piece system) or a new wafer (2-piece system), measure your stoma using the stoma guide in each box of supplies. Trace the shape of your stoma onto the back of the new pouch or the back of the new wafer. Cut out the opening. Remove the paper backing and set it aside. Optional: Apply a skin barrier powder to surrounding skin if it is irritated (bare or weeping), and  dust off the excess. Optional: Apply a skin-prep wipe (such as Skin Prep or All-Kare) to the skin around the stoma, and let it dry. Do not apply this solution if the skin is irritated (red, tender, or broken) or if you have shaved around the stoma. Optional: Apply a skin barrier paste (such as Stomahesive, Coloplast, or Premium) around the opening cut in the back of the pouch or wafer. Allow it to dry for 30 to 60 seconds. Hold the pouch (1-piece system) or wafer (2-piece system) with the sticky side toward your body. Make sure the skin around the stoma is wrinkle-free. Center the opening on the stoma, then press firmly to your abdomen (Fig. 4). Look in the mirror to check  if you are placing the pouch, or wafer, in the right position. For a 2-piece system, snap the pouch onto the wafer. Make sure it snaps into place securely. Place your hand over the stoma and the pouch or wafer for about 30 seconds. The heat from your hand can help the pouch or wafer stick to your skin. Add deodorant (such as Super Banish or Nullo) to your pouch. Other options include food extracts such as vanilla oil and peppermint extract. Add about 10 drops of the deodorant to the pouch. Then apply the closure clamp. Note: Do not use toxic chemicals or commercial cleaning agents in your pouch. These substances may harm the stoma. Optional: For extra seal, apply tape to all 4 sides around the pouch or wafer, as if you were framing a picture. You may use any brand of medical adhesive tape. Change your pouch every 5 to 7 days. Change it immediately if a leak occurs.  Wash your hands afterwards. If you are wearing a 2-piece system, you may use 2 new pouches per week and alternate them. Rinse the pouch with mild soap and warm water and hang it to dry for the next day. Apply the fresh pouch. Alternate the 2 pouches like this for a week. After a week, change the wafer and begin with 2 new pouches. Place the old pouches in a plastic bag, and put  them in the trash.  Tips for colostomy care  Applying Your Pouch You may stand or sit to apply your pouch. Keep the skin where you apply the pouch wrinkle-free. If the skin around the pouch is wrinkled, the seal may break when your skin stretches. If hair grows close to your stoma, you may trim off the hair with scissors, an electric razor, or a safety razor. Always have a mirror nearby so you can get a better view of your stoma. When you apply a new pouch, write the date on the adhesive tape. This will remind you of when you last changed your pouch.  Changing Your Pouch The best time to change your pouch is in the morning, before eating or drinking anything. Your stoma can function at any time, but it will function more after eating or drinking.  Emptying Your Pouch Empty your pouch when it is one-third full (of urine, stool, and/or gas). If you wait until your pouch is fuller than this, it will be more difficult to empty and more noticeable. When you empty your pouch, either put toilet paper in the toilet bowl first, or flush the toilet while you empty the pouch. This will reduce splashing. You can empty the pouch between your legs or to one side while sitting, or while standing or stooping. If you have a 2-piece system, you can snap off the pouch to empty it. Remember that your stoma may function during this time. If you wish to rinse your pouch after you empty it, a [place] baster can be helpful. When using a baster, squirt water up into the pouch through the opening at the bottom. With a 2-piece system, you can snap off the pouch to rinse it. After rinsing  your pouch, empty it into the toilet. When rinsing your pouch at home, put a few granules of Dreft soap in the rinse water. This helps lubricate and freshen your pouch. The inside of your pouch can be sprayed with non-stick cooking oil (Pam spray). This may help reduce stool sticking to the inside of the pouch.  Bathing You may  shower  or bathe with your pouch on or off. Remember that your stoma may function during this time. The materials you use to wash your stoma and the skin around it should be clean, but they do not need to be sterile. Wearing Your Pouch During hot weather, or if you perspire a lot in general, wear a cover over your pouch. This may prevent a rash on your skin under the pouch. Pouch covers are sold at ostomy supply stores. Wear the pouch inside your underwear for better support. Watch your weight. Any gain or loss of 10 to 15 pounds or more can change the way your pouch fits.  Going Away From Home A collapsible cup (like those that come in travel kits) or a soft plastic squirt bottle with a pull-up top (like a travel bottle for shampoo) can be used for rinsing your pouch when you are away from home. Tilt the opening of the pouch at an upward angle when using a cup to rinse. Carry wet wipes or extra tissues to use in public bathrooms. Carry an extra pouching system with you at all times. Never keep ostomy supplies in the glove compartment of your car. Extreme heat or cold can damage the skin barriers and adhesive wafers on the pouch. When you travel, carry your ostomy supplies with you at all times. Keep them within easy reach. Do not pack ostomy supplies in baggage that will be checked or otherwise separated from you, because your baggage might be lost. If youre traveling out of the country, it is helpful to have a letter stating that you are carrying ostomy supplies as a medical necessity. If you need ostomy supplies while traveling, look in the yellow pages of the telephone book under Surgical Supplies. Or call the local ostomy organization to find out where supplies are available. Do not let your ostomy supplies get low. Always order new pouches before you use the last one.  Reducing Odor Limit foods such as broccoli, cabbage, onions, fish, and garlic in your diet to help reduce odor. Each time you  empty your pouch, carefully clean the opening of the pouch, both inside and outside, with toilet paper. Rinse your pouch 1 or 2 times daily after you empty it (see directions for emptying your pouch and going away from home). Add deodorant (such as Super Banish or Nullo) to your pouch. Use air deodorizers in your bathroom. Do not add aspirin to your pouch. Even though aspirin can help prevent odor, it could cause ulcers on your stoma.  When to call the doctor Call the doctor if you have any of the following symptoms: Purple, black, or white stoma Severe cramps lasting more than 6 hours Severe watery discharge from the stoma lasting more than 6 hours No output from the colostomy for 3 days Excessive bleeding from your stoma Swelling of your stoma to more than 1/2-inch larger than usual Pulling inward of your stoma below skin level Severe skin irritation or deep ulcers Bulging or other changes in your abdomen  When to call your ostomy nurse Call your ostomy/enterostomal therapy (ET) nurse if any of the following occurs: Frequent leaking of your pouching system Change in size or appearance of your stoma, causing discomfort or problems with your pouch Skin rash or rawness Weight gain or loss that causes problems with your pouch  Ostomy Support Information  Yes, youve heard that people get along just fine with only one of their eyes, or one of their lungs, or  one of their kidneys. But you also know that you have only one intestine and only one bladder, and that leaves you feeling awfully empty, both physically and emotionally: You think no other people go around without part of their intestine with the ends of their intestines sticking out through their abdominal walls.  Well, you are wrong! There are nearly three quarters of a million people in the [place] who have an ostomy; people who have had surgery to remove all or part of their colons or bladders. There is even a national association, the  [place] [employer] [place] with over 350 local affiliated support groups that are organized by volunteers who provide peer support and counseling. [place] has a toll free telephone num-ber, [phone] and an educational,  interactive website, www.ostomy.org   An ostomy is an opening in the belly (abdominal wall) made by surgery. Ostomates are people who have had this procedure. The opening (stoma) allows the kidney or bowel to discharge waste. An external pouch covers the stoma to collect waste. Pouches are are a simple bag and are odor free. Different companies have disposable or reusable pouches to fit one's lifestyle. An ostomy can either be temporary or permanent.  THERE ARE THREE MAIN TYPES OF OSTOMIES  Colostomy. A colostomy is a surgically created opening in the large intestine (colon).  Ileostomy. An ileostomy is a surgically created opening in the small intestine.  Urostomy. A urostomy is a surgically created opening to divert urine away from the bladder. FREQUENTLY ASKED QUESTIONS   Why havent you met any of these folks who have an ostomy?  Well, maybe you have! You just did not recognize them because an ostomy doesn't show. It can be kept secret if you wish. Why, maybe some of your best friends, office associates or neighbors have an ostomy ... you never can tell.   People facing ostomy surgery have many quality-of-life questions like:  Will you bulge? Smell? Make noises? Will you feel waste leaving your body? Will you be a captive of the toilet? Will you starve? Be a social outcast? Get/stay married? Have babies? Easily bathe, go swimming, bend over?  OK, lets look at what you can expect:  Will you bulge?  Remember, without part of the intestine or bladder, and its contents, you should have a flatter tummy than before. You can expect to wear, with little exception, what you wore before surgery ... and this in-cludes tight clothing and bathing suits.  Will you smell?    Today, thanks to modern odor proof pouching systems, you can walk into an ostomy support group meeting and not smell anything that is foul or offensive. And, for those with an ileostomy or colostomy who are concerned about odor when emptying their pouch, there are in-pouch deodorants that can be used to eliminate any waste odors that may exist.  Will you make noises?  Everyone produces gas, especially if they are an air-swallower. But intestinal sounds that occur from time to time are no differ-ent than a gurgling tummy, and quite often your clothing will muffle any sounds.   Will you feel the waste discharges?  For those with a colostomy or ileostomy there might be a slight pressure when waste leaves your body, but understand that the intestines have no nerve endings, so there will be no unpleasant sensations. Those with a urostomy will probably be unaware of any kidney drainage.  Will you be a captive of the toilet?  Immediately post-op you will spend more  time in the bathroom than you will after your body recovers from surgery. Every person is different, but on average those with an ileostomy or urostomy may empty their pouches 4 to 6 times a day; a little  less if you have a colostomy. The average wear time between pouch system changes is 3 to 5 days and the changing process should take less than 30 minutes.  Will I need to be on a special diet? Most people return to their normal diet when they have recovered from surgery. Be sure to chew your food well, eat a well-balanced diet and drink plenty of fluids. If you experience problems with a certain food, wait a couple of weeks and try it again. Will there be odor and noises? Pouching systems are designed to be odor-proof or odor-resistant. There are deodorants that can be used in the pouch. Medications are also available to help reduce odor. Limit gas-producing foods and carbonated beverages. You will experience less gas and fewer noises as you heal  from surgery. How much time will it take to care for my ostomy? At first, you may spend a lot of time learning about your ostomy and how to take care of it. As you become more comfortable and skilled at changing the pouching system, it will take very little time to care for it.  Will I be able to return to work? People with ostomies can perform most jobs. As soon as you have healed from surgery, you should be able to return to work. Heavy lifting (more than 10 pounds) may be discouraged.  What about intimacy? Sexual relationships and intimacy are important and fulfilling aspects of your life. They should continue after ostomy surgery. Intimacy-related concerns should be discussed openly between you and your partner.  Can I wear regular clothing? You do not need to wear special clothing. Ostomy pouches are fairly flat and barely noticeable. Elastic undergarments will not hurt the stoma or prevent the ostomy from functioning.  Can I participate in sports? An ostomy should not limit your involvement in sports. Many people with ostomies are runners, skiers, swimmers or participate in other active lifestyles. Talk with your caregiver first before doing heavy physical activity.  Will you starve?  Not if you follow doctors orders at each stage of your post-op adjustment. There is no such thing as an ostomy diet. Some people with an ostomy will be able to eat and tolerate anything; others may find diffi-culty with some foods. Each person is an individual and must determine, by trial, what is best for them. A good practice for all is to drink plenty of water.  Will you be a social outcast?  Have you met anyone who has an ostomy and is a social outcast? Why should you be the first? Only your attitude and self image will effect how you are treated. No confi-dent person is an Occupational psychologist.   PROFESSIONAL HELP  Resources are available if you need help or have questions about your ostomy.    Specially trained  nurses called Wound, Ostomy Continence Nurses (WOCN) are available for consultation in most major medical centers.   Consider getting an ostomy consult with Cena Benton at [facility] to help troubleshoot stoma pouch fittings and other issues with your ostomy: [phone]   The [employer] Ostomy Association (UOA) is a group made up of many local chapters throughout the [place]. These local groups hold meetings and provide support to prospective and existing ostomates. They sponsor educational events  and have qualified visitors to make personal or telephone visits. Contact the UOA for the chapter nearest you and for other educational publications.  More detailed information can be found in Colostomy Guide, a publication of the [employer] (UOA). Contact UOA at 1-360-829-0415 or visit their web site at https://arellano.com/. The website contains links to other sites, suppliers and resources. Document Released: 05/27/2003 Document Revised: 08/16/2011 Document Reviewed: 09/25/2008 [facility] Patient Information 2013 [place].  GETTING TO GOOD BOWEL HEALTH. Irregular bowel habits such as constipation and diarrhea can lead to many problems over time.  Having one soft bowel movement a day is the most important way to prevent further problems.  The anorectal canal is designed to handle stretching and feces to safely manage our ability to get rid of solid waste (feces, poop, stool) out of our body.  BUT, hard constipated stools can act like ripping concrete bricks and diarrhea can be a burning fire to this very sensitive area of our body, causing inflamed hemorrhoids, anal fissures, increasing risk is perirectal abscesses, abdominal pain/bloating, an making irritable bowel worse.      The goal: ONE SOFT BOWEL MOVEMENT A DAY!  To have soft, regular bowel movements:   Drink plenty of fluids, consider 4-6 tall glasses of water a day.    Take plenty of fiber.  Fiber is the undigested  part of plant food that passes into the colon, acting s natures broom to encourage bowel motility and movement.  Fiber can absorb and hold large amounts of water. This results in a larger, bulkier stool, which is soft and easier to pass. Work gradually over several weeks up to 6 servings a day of fiber (25g a day even more if needed) in the form of: o Vegetables -- Root (potatoes, carrots, turnips), leafy green (lettuce, salad greens, celery, spinach), or cooked high residue (cabbage, broccoli, etc) o Fruit -- Fresh (unpeeled skin & pulp), Dried (prunes, apricots, cherries, etc ),  or stewed ( applesauce)  o Whole grain breads, pasta, etc (whole wheat)  o Bran cereals   Bulking Agents -- This type of water-retaining fiber generally is easily obtained each day by one of the following:  o Psyllium bran -- The psyllium plant is remarkable because its ground seeds can retain so much water. This product is available as Metamucil, Konsyl, Effersyllium, Per Diem Fiber, or the less expensive generic preparation in drug and health food stores. Although labeled a laxative, it really is not a laxative.  o Methylcellulose -- This is another fiber derived from wood which also retains water. It is available as Citrucel. o Polyethylene Glycol - and artificial fiber commonly called Miralax or Glycolax.  It is helpful for people with gassy or bloated feelings with regular fiber o Flax Seed - a less gassy fiber than psyllium  No reading or other relaxing activity while on the toilet. If bowel movements take longer than 5 minutes, you are too constipated  AVOID CONSTIPATION.  High fiber and water intake usually takes care of this.  Sometimes a laxative is needed to stimulate more frequent bowel movements, but   Laxatives are not a good long-term solution as it can wear the colon out.  They can help jump-start bowels if constipated, but should be relied on constantly without discussing with your doctor o Osmotics  (Milk of Magnesia, Fleets phosphosoda, Magnesium citrate, MiraLax, GoLytely) are safer than  o Stimulants (Senokot, Castor Oil, Dulcolax, Ex Lax)    o Avoid taking laxatives for  more than 7 days in a row.   IF SEVERELY CONSTIPATED, try a Bowel Retraining Program: o Do not use laxatives.  o Eat a diet high in roughage, such as bran cereals and leafy vegetables.  o Drink six (6) ounces of prune or apricot juice each morning.  o Eat two (2) large servings of stewed fruit each day.  o Take one (1) heaping tablespoon of a psyllium-based bulking agent twice a day. Use sugar-free sweetener when possible to avoid excessive calories.  o Eat a normal breakfast.  o Set aside 15 minutes after breakfast to sit on the toilet, but do not strain to have a bowel movement.  o If you do not have a bowel movement by the third day, use an enema and repeat the above steps.   Controlling diarrhea o Switch to liquids and simpler foods for a few days to avoid stressing your intestines further. o Avoid dairy products (especially milk & ice cream) for a short time.  The intestines often can lose the ability to digest lactose when stressed. o Avoid foods that cause gassiness or bloating.  Typical foods include beans and other legumes, cabbage, broccoli, and dairy foods.  Every person has some sensitivity to other foods, so listen to our body and avoid those foods that trigger problems for you. o Adding fiber (Citrucel, Metamucil, psyllium, Miralax) gradually can help thicken stools by absorbing excess fluid and retrain the intestines to act more normally.  Slowly increase the dose over a few weeks.  Too much fiber too soon can backfire and cause cramping & bloating. o Probiotics (such as active yogurt, Align, etc) may help repopulate the intestines and colon with normal bacteria and calm down a sensitive digestive tract.  Most studies show it to be of mild help, though, and such products can be  costly. o Medicines: - Bismuth subsalicylate (ex. Kayopectate, Pepto Bismol) every 30 minutes for up to 6 doses can help control diarrhea.  Avoid if pregnant. - Loperamide (Immodium) can slow down diarrhea.  Start with two tablets (60m total) first and then try one tablet every 6 hours.  Avoid if you are having fevers or severe pain.  If you are not better or start feeling worse, stop all medicines and call your doctor for advice o Call your doctor if you are getting worse or not better.  Sometimes further testing (cultures, endoscopy, X-ray studies, bloodwork, etc) may be needed to help diagnose and treat the cause of the diarrhea.  TROUBLESHOOTING IRREGULAR BOWELS 1) Avoid extremes of bowel movements (no bad constipation/diarrhea) 2) Miralax 17gm mixed in 8oz. water or juice-daily. May use BID as needed.  3) Gas-x,Phazyme, etc. as needed for gas & bloating.  4) Soft,bland diet. No spicy,greasy,fried foods.  5) Prilosec over-the-counter as needed  6) May hold gluten/wheat products from diet to see if symptoms improve.  7)  May try probiotics (Align, Activa, etc) to help calm the bowels down 7) If symptoms become worse call back immediately.  Managing Pain  Pain after surgery or related to activity is often due to strain/injury to muscle, tendon, nerves and/or incisions.  This pain is usually short-term and will improve in a few months.   Many people find it helpful to do the following things TOGETHER to help speed the process of healing and to get back to regular activity more quickly:  6. Avoid heavy physical activity at first a. No lifting greater than 20 pounds at first, then increase to lifting as  tolerated over the next few weeks b. Do not push through the pain.  Listen to your body and avoid positions and maneuvers than reproduce the pain.  Wait a few days before trying something more intense c. Walking is okay as tolerated, but go slowly and stop when getting sore.  If you can walk  30 minutes without stopping or pain, you can try more intense activity (running, jogging, aerobics, cycling, swimming, treadmill, sex, sports, weightlifting, etc ) d. Remember: If it hurts to do it, then dont do it!  7. Take Acetaminophen Anti-inflammatory medication i. Acetaminophen 522m tabs (Tylenol) 1-2 pills with every meal and just before bedtime (avoid if you have liver problems) ii. Take with food/snack around the clock for 1-2 weeks iii. This helps the muscle and nerve tissues become less irritable and calm down faster  8. Use a Heating pad or Ice/Cold Pack a. 4-6 times a day b. May use warm bath/hottub  or showers  9. Try Gentle Massage and/or Stretching  a. at the area of pain many times a day b. stop if you feel pain - do not overdo it  Try these steps together to help you body heal faster and avoid making things get worse.  Doing just one of these things may not be enough.    If you are not getting better after two weeks or are noticing you are getting worse, contact our office for further advice; we may need to re-evaluate you & see what other things we can do to help.

## 2015-03-21 NOTE — Consult Note (Addendum)
WOC ostomy consult note Stoma type/location: LUQ Colostomy Stomal assessment/size: Slightly smaller than 1 and 3/4 inches round, moist, somewhat dusky, edeamtous Peristomal assessment: Intact, clear Treatment options for stomal/peristomal skin: Skin barrier ring Output Scant serosanguinous Ostomy pouching: 2pc. 2 and 1/4 inches with skin barrier ring Education provided: Patient session for initial pouch change and stoma assessment.  Patient is alert and congenial, minor impairment (consistent with age-related learning principles) noted.  GI A&P, stoma characteristics, pouch characteristics, pouch removal, stoma sizing, pouch preparation, pouching system application taught.  Patient is shown the Lock and Roll closure mechanism and he attempts to do x2, once successfully with much cueing. We also simulate emptying and cleaning the tail closure of the pouch with toilet paper.  Pouch emptying (4-5 times/day) and pouch changes (twice weekly) are clarified and differentiated. I will see patient again on Monday and an appointment has been arranged with the son and daughter-in-law for 3pm on that day. Noted is plan for discharge to home with [facility] support. I have some early concern about this at this time. Supplies and educational booklet provided to the room. Enrolled patient in [employer] program: Yes (Skin barriers (CeraPlus), pouches (opaque with gas filter), CeraPlus skin barrier rings and closed end, opaque pouches) WOC nursing team will follow, and will remain available to this patient, the nursing, surgical and medical teams.   Thanks, Maudie Flakes, MSN, RN, [place], [place], [place] [phone])

## 2015-03-21 NOTE — Care Management Note (Signed)
Case Management Note  Patient Details  Name: Brad Sanchez MRN: [id number] Date of Birth: [birth year]  Subjective/Objective:      Post-Op Robotic Assisted APR              Action/Plan: Discharge planning, spoke with patient at bedside. States he plans to d/c to home with support from his son who lives with him. Discussed need for [facility] services at d/c, patient has chosen [facility]. Contacted [facility] for referral.   Expected Discharge Date:                  Expected Discharge Plan:  [place]  In-House Referral:  NA  Discharge planning Services  CM Consult  Post Acute Care Choice:  Home Health Choice offered to:     DME Arranged:  N/A DME Agency:  NA  HH Arranged:  RN, Disease Management, PT [place] Agency:  NA  Status of Service:  Completed, signed off  Medicare Important Message Given:    Date Medicare IM Given:    Medicare IM give by:    Date Additional Medicare IM Given:    Additional Medicare Important Message give by:     If discussed at [place] of Stay Meetings, dates discussed:    Additional Comments:  Guadalupe Maple, RN 03/21/2015, 10:20 AM

## 2015-03-22 LAB — PREPARE RBC (CROSSMATCH)

## 2015-03-22 LAB — BASIC METABOLIC PANEL
ANION GAP: 4 — AB (ref 5–15)
BUN: 10 mg/dL (ref 6–20)
CALCIUM: 8 mg/dL — AB (ref 8.9–10.3)
CO2: 29 mmol/L (ref 22–32)
Chloride: 103 mmol/L (ref 101–111)
Creatinine, Ser: 0.89 mg/dL (ref 0.61–1.24)
GLUCOSE: 110 mg/dL — AB (ref 65–99)
POTASSIUM: 4.1 mmol/L (ref 3.5–5.1)
Sodium: 136 mmol/L (ref 135–145)

## 2015-03-22 LAB — CBC
HEMATOCRIT: 20.5 % — AB (ref 39.0–52.0)
HEMOGLOBIN: 6.7 g/dL — AB (ref 13.0–17.0)
MCH: 27.8 pg (ref 26.0–34.0)
MCHC: 32.7 g/dL (ref 30.0–36.0)
MCV: 85.1 fL (ref 78.0–100.0)
Platelets: 123 10*3/uL — ABNORMAL LOW (ref 150–400)
RBC: 2.41 MIL/uL — AB (ref 4.22–5.81)
RDW: 15.1 % (ref 11.5–15.5)
WBC: 5.6 10*3/uL (ref 4.0–10.5)

## 2015-03-22 MED ORDER — FERROUS SULFATE 325 (65 FE) MG PO TABS
325.0000 mg | ORAL_TABLET | Freq: Two times a day (BID) | ORAL | Status: DC
  Administered 2015-03-22 – 2015-03-24 (×4): 325 mg via ORAL
  Filled 2015-03-22 (×6): qty 1

## 2015-03-22 MED ORDER — SODIUM CHLORIDE 0.9 % IV SOLN
Freq: Once | INTRAVENOUS | Status: AC
  Administered 2015-03-22: 14:00:00 via INTRAVENOUS

## 2015-03-22 NOTE — Progress Notes (Signed)
CRITICAL VALUE ALERT  Critical value received:  Hgb 6.7  Date of notification:  03/22/2015  Time of notification:  0615  Critical value read back:Yes.    Nurse who received alert:  Joellyn Rued   MD notified (1st page):  [place]osen[place]ower  Time of first page:  0620  MD notified (2nd page):  Time of second page:  Responding MD:  Zella Richer  Time MD responded:  [phone]

## 2015-03-22 NOTE — Progress Notes (Signed)
3 Days Post-Op  Subjective: Walking in the hall and tolerating his diet.  Objective: Vital signs in last 24 hours: Temp:  [98 F (36.7 C)-98.7 F (37.1 C)] 98 F (36.7 C) (10/15 0617) Pulse Rate:  [66-104] 92 (10/15 0617) Resp:  [18] 18 (10/15 0617) BP: (99-114)/(52-64) 101/63 mmHg (10/15 0617) SpO2:  [98 %-100 %] 98 % (10/15 0617) Last BM Date: 03/21/15  Intake/Output from previous day: 10/14 0701 - 10/15 0700 In: 587.8 [I.V.:587.8] Out: 3810 [Urine:3650; Drains:110; Stool:50] Intake/Output this shift: Total I/O In: -  Out: 1025 [Urine:1000; Drains:25]  PE: General- In NAD Abdomen-soft, colostomy viable with liquid stool output, thin serosanguinous drain output Perineum-no active drainage  Lab Results:   Recent Labs  03/21/15 0525 03/22/15 0520  WBC 8.4 5.6  HGB 7.3* 6.7*  HCT 22.6* 20.5*  PLT 139* 123*   BMET  Recent Labs  03/21/15 0525 03/22/15 0520  NA 135 136  K 4.4 4.1  CL 103 103  CO2 29 29  GLUCOSE 118* 110*  BUN 12 10  CREATININE 0.87 0.89  CALCIUM 8.1* 8.0*   PT/INR No results for input(s): LABPROT, INR in the last 72 hours. Comprehensive Metabolic Panel:    Component Value Date/Time   NA 136 03/22/2015 0520   NA 135 03/21/2015 0525   K 4.1 03/22/2015 0520   K 4.4 03/21/2015 0525   CL 103 03/22/2015 0520   CL 103 03/21/2015 0525   CO2 29 03/22/2015 0520   CO2 29 03/21/2015 0525   BUN 10 03/22/2015 0520   BUN 12 03/21/2015 0525   CREATININE 0.89 03/22/2015 0520   CREATININE 0.87 03/21/2015 0525   GLUCOSE 110* 03/22/2015 0520   GLUCOSE 118* 03/21/2015 0525   CALCIUM 8.0* 03/22/2015 0520   CALCIUM 8.1* 03/21/2015 0525   AST 19 01/28/2015 1130   AST 20 01/21/2015 1350   ALT 15* 01/28/2015 1130   ALT 15* 01/21/2015 1350   ALKPHOS 55 01/28/2015 1130   ALKPHOS 62 01/21/2015 1350   BILITOT 0.4 01/28/2015 1130   BILITOT 0.4 01/21/2015 1350   PROT 5.9* 01/28/2015 1130   PROT 5.7* 01/21/2015 1350   ALBUMIN 3.1* 01/28/2015 1130   ALBUMIN 3.0* 01/21/2015 1350     Studies/Results: No results found.  Anti-infectives: Anti-infectives    Start     Dose/Rate Route Frequency Ordered Stop   03/20/15 0400  cefoTEtan (CEFOTAN) 2 g in dextrose 5 % 50 mL IVPB     2 g 100 mL/hr over 30 Minutes Intravenous Every 12 hours 03/19/15 1824 03/20/15 0500   03/19/15 0952  cefoTEtan (CEFOTAN) 2 g in dextrose 5 % 50 mL IVPB     2 g 100 mL/hr over 30 Minutes Intravenous On call to O.R. 03/19/15 0952 03/19/15 1621      Assessment Active Problems:   Rectal cancer ([place]) s/p APR-bowel function has returned   ABL anemia-hemoglobin < 7    LOS: 3 days   Plan:  Transfuse one unit of PRBCs. Start Iron.  Foley out tomorrow.   Brad Sanchez J 03/22/2015

## 2015-03-23 LAB — CBC
HCT: 26.6 % — ABNORMAL LOW (ref 39.0–52.0)
Hemoglobin: 8.8 g/dL — ABNORMAL LOW (ref 13.0–17.0)
MCH: 28.2 pg (ref 26.0–34.0)
MCHC: 33.1 g/dL (ref 30.0–36.0)
MCV: 85.3 fL (ref 78.0–100.0)
PLATELETS: 141 10*3/uL — AB (ref 150–400)
RBC: 3.12 MIL/uL — ABNORMAL LOW (ref 4.22–5.81)
RDW: 15 % (ref 11.5–15.5)
WBC: 5.5 10*3/uL (ref 4.0–10.5)

## 2015-03-23 NOTE — Progress Notes (Signed)
4 Days Post-Op  Subjective: Foley out this AM.  Has not voided yet. Tolerating diet.  Has colostomy leakage last night.  Objective: Vital signs in last 24 hours: Temp:  [98.1 F (36.7 C)-99.1 F (37.3 C)] 98.6 F (37 C) (10/16 0700) Pulse Rate:  [90-97] 90 (10/16 0700) Resp:  [18-20] 18 (10/16 0700) BP: (111-124)/(45-75) 124/75 mmHg (10/16 0700) SpO2:  [95 %-100 %] 98 % (10/16 0700) Last BM Date: 03/21/15  Intake/Output from previous day: 10/15 0701 - 10/16 0700 In: 825 [I.V.:505; Blood:320] Out: 3215 [Urine:2525; Drains:40; Stool:650] Intake/Output this shift: Total I/O In: 90 [P.O.:90] Out: -   PE: General- In NAD Abdomen-soft, colostomy viable with liquid stool output, thin serosanguinous drain output Perineum- wound clean with no active drainage  Lab Results:   Recent Labs  03/22/15 0520 03/23/15 0624  WBC 5.6 5.5  HGB 6.7* 8.8*  HCT 20.5* 26.6*  PLT 123* 141*   BMET  Recent Labs  03/21/15 0525 03/22/15 0520  NA 135 136  K 4.4 4.1  CL 103 103  CO2 29 29  GLUCOSE 118* 110*  BUN 12 10  CREATININE 0.87 0.89  CALCIUM 8.1* 8.0*   PT/INR No results for input(s): LABPROT, INR in the last 72 hours. Comprehensive Metabolic Panel:    Component Value Date/Time   NA 136 03/22/2015 0520   NA 135 03/21/2015 0525   K 4.1 03/22/2015 0520   K 4.4 03/21/2015 0525   CL 103 03/22/2015 0520   CL 103 03/21/2015 0525   CO2 29 03/22/2015 0520   CO2 29 03/21/2015 0525   BUN 10 03/22/2015 0520   BUN 12 03/21/2015 0525   CREATININE 0.89 03/22/2015 0520   CREATININE 0.87 03/21/2015 0525   GLUCOSE 110* 03/22/2015 0520   GLUCOSE 118* 03/21/2015 0525   CALCIUM 8.0* 03/22/2015 0520   CALCIUM 8.1* 03/21/2015 0525   AST 19 01/28/2015 1130   AST 20 01/21/2015 1350   ALT 15* 01/28/2015 1130   ALT 15* 01/21/2015 1350   ALKPHOS 55 01/28/2015 1130   ALKPHOS 62 01/21/2015 1350   BILITOT 0.4 01/28/2015 1130   BILITOT 0.4 01/21/2015 1350   PROT 5.9* 01/28/2015 1130   PROT 5.7* 01/21/2015 1350   ALBUMIN 3.1* 01/28/2015 1130   ALBUMIN 3.0* 01/21/2015 1350     Studies/Results: No results found.  Anti-infectives: Anti-infectives    Start     Dose/Rate Route Frequency Ordered Stop   03/20/15 0400  cefoTEtan (CEFOTAN) 2 g in dextrose 5 % 50 mL IVPB     2 g 100 mL/hr over 30 Minutes Intravenous Every 12 hours 03/19/15 1824 03/20/15 0500   03/19/15 0952  cefoTEtan (CEFOTAN) 2 g in dextrose 5 % 50 mL IVPB     2 g 100 mL/hr over 30 Minutes Intravenous On call to O.R. 03/19/15 0952 03/19/15 1621      Assessment Active Problems:   Rectal cancer ([place]) s/p APR-bowel function has returned   ABL anemia-hemoglobin up to 8.8 after transfusion.    LOS: 4 days   Plan:  Heplock IV.  See how he voids today, if unable will need to reinsert foley.  Hopefully home tomorrow.   Althea Backs J 03/23/2015

## 2015-03-23 NOTE — Progress Notes (Signed)
Pt wanted to take a bath and get cleaned up.  Colostomy bag burped and emptied prior to bath.  Upon return to bed, colostomy bag had completely filled up and burst.  650ccs of loose brown stool was emptied from the bag. More stool was on the bed and floor that was not calculated. Pt given a shower, colostomy bag changed, and pt returned to bed. Pt comfortable. Will continue to monitor.   Brad Sanchez

## 2015-03-24 DIAGNOSIS — Z933 Colostomy status: Secondary | ICD-10-CM

## 2015-03-24 MED ORDER — OXYCODONE HCL 5 MG PO TABS: 5.0000 mg | ORAL_TABLET | ORAL | Status: AC | PRN

## 2015-03-24 NOTE — Care Management Important Message (Signed)
Important Message  Patient Details  Name: Brad Sanchez MRN: [id number] Date of Birth: 03-Mar-1935   Medicare Important Message Given:  Yes-second notification given    Camillo Flaming 03/24/2015, 12:53 [place] Message  Patient Details  Name: Brad Sanchez MRN: [id number] Date of Birth: 12-Sep-1934   Medicare Important Message Given:  Yes-second notification given    Camillo Flaming 03/24/2015, 12:53 PM

## 2015-03-24 NOTE — Discharge Summary (Signed)
Physician Discharge Summary  Patient ID: Brad Sanchez MRN: [id number] DOB/AGE: 1935-01-20 80 y.o.  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  No obvious metastatic disease on visceral parietal peritoneum or liver.  SURGEON:    Surgeon(s): [name], MD Michael Boston, MD  Consults: None  Hospital Course:   The patient underwent the surgery above.  Pathology c/w ypT2, pN0 (0/12 LN), pMX after neoadj chemoZ+XRT.  Postoperatively, the patient gradually mobilized and advanced to a solid diet.  Pain and other symptoms were treated aggressively.    By the time of discharge, the patient was walking well the hallways, eating food, having flatus.  Drain care & colostomy care given.  Pain was well-controlled on an oral medications.  Based on meeting discharge criteria and continuing to recover, I felt it was safe for the patient to be discharged from the hospital to further recover with close followup. Home health has been set up.  Copy of pathology report given.  Postoperative recommendations were discussed in detail.  They  are written as well.   Significant Diagnostic Studies:  Results for orders placed or performed during the hospital encounter of 03/19/15 (from the past 72 hour(s))  Basic metabolic panel     Status: Abnormal   Collection Time: 03/22/15  5:20 AM  Result Value Ref Range   Sodium 136 135 - 145 mmol/L   Potassium 4.1 3.5 - 5.1 mmol/L   Chloride 103 101 - 111 mmol/L   CO2 29 22 - 32 mmol/L   Glucose, Bld 110 (H) 65 - 99 mg/dL   BUN 10 6 - 20 mg/dL   Creatinine, Ser 0.89 0.61 - 1.24 mg/dL   Calcium 8.0 (L) 8.9 - 10.3 mg/dL   GFR calc non Af Amer >60 >60 mL/min   GFR calc Af Amer >60 >60 mL/min    Comment: (NOTE) The eGFR has been calculated using the CKD EPI equation. This calculation has not been validated in all clinical situations. eGFR's persistently <60 mL/min signify possible Chronic Kidney Disease.    Anion gap 4 (L) 5 - 15  CBC     Status: Abnormal   Collection Time: 03/22/15  5:20 AM  Result Value Ref Range   WBC 5.6 4.0 - 10.5 K/uL   RBC 2.41 (L) 4.22 - 5.81 MIL/uL   Hemoglobin 6.7 (LL) 13.0 - 17.0 g/dL    Comment: REPEATED TO VERIFY CRITICAL RESULT CALLED TO, READ BACK BY AND VERIFIED WITH: YODER,S/5W @0609  ON 03/22/15 BY KARCZEWSKI,S.    HCT 20.5 (L) 39.0 - 52.0 %   MCV 85.1 78.0 - 100.0 fL   MCH 27.8 26.0 - 34.0 pg   MCHC 32.7 30.0 - 36.0 g/dL  RDW 15.1 11.5 - 15.5 %   Platelets 123 (L) 150 - 400 K/uL  Type and screen [place]     Status: None (Preliminary result)   Collection Time: 03/22/15 11:30 AM  Result Value Ref Range   ABO/RH(D) B POS    Antibody Screen NEG    Sample Expiration 03/25/2015    Unit Number [id number]    Blood Component Type RED CELLS,LR    Unit division 00    Status of Unit ISSUED,FINAL    Transfusion Status OK TO TRANSFUSE    Crossmatch Result Compatible    Unit Number [id number]    Blood Component Type RED CELLS,LR    Unit division 00    Status of Unit ALLOCATED    Transfusion Status OK TO TRANSFUSE     Crossmatch Result Compatible   Prepare RBC     Status: None   Collection Time: 03/22/15 11:30 AM  Result Value Ref Range   Order Confirmation ORDER PROCESSED BY BLOOD BANK   CBC     Status: Abnormal   Collection Time: 03/23/15  6:24 AM  Result Value Ref Range   WBC 5.5 4.0 - 10.5 K/uL   RBC 3.12 (L) 4.22 - 5.81 MIL/uL   Hemoglobin 8.8 (L) 13.0 - 17.0 g/dL    Comment: DELTA CHECK NOTED POST TRANSFUSION SPECIMEN    HCT 26.6 (L) 39.0 - 52.0 %   MCV 85.3 78.0 - 100.0 fL   MCH 28.2 26.0 - 34.0 pg   MCHC 33.1 30.0 - 36.0 g/dL   RDW 15.0 11.5 - 15.5 %   Platelets 141 (L) 150 - 400 K/uL    No results found.  Discharge Exam: Blood pressure 132/71, pulse 91, temperature 98.4 F (36.9 C), temperature source Oral, resp. rate 16, height 5' 6"  (1.676 m), weight 66.225 kg (146 lb), SpO2 100 %.  General: Pt awake/alert/oriented x4 in no major acute distress Eyes: PERRL, normal EOM. Sclera nonicteric Neuro: CN II-XII intact w/o focal sensory/motor deficits. Lymph: No head/neck/groin lymphadenopathy Psych:  No delerium/psychosis/paranoia HENT: Normocephalic, Mucus membranes moist.  No thrush Neck: Supple, No tracheal deviation Chest: No pain.  Good respiratory excursion. CV:  Pulses intact.  Regular rhythm MS: Normal AROM mjr joints.  No obvious deformity Abdomen: Soft, Nondistended.  Nontender.  No incarcerated hernias.  Colostomy pink w gas/stool Perineal incsion clean.  Blake drain serosanguinous Ext:  SCDs BLE.  No significant edema.  No cyanosis Skin: No petechiae / purpura  Discharged Condition: good   Past Medical History  Diagnosis Date  . High cholesterol   . Anxiety   . Heart murmur     Dr. Emilee Hero told him 15 years ago  . Headache   . Cancer [facility])     rectal    Past Surgical History  Procedure Laterality Date  . Colonoscopy N/A 11/13/2014    Procedure: COLONOSCOPY;  Surgeon: Rogene Houston, MD;  Location: [facility];  Service: Endoscopy;  Laterality: N/A;   730  . Eus N/A 12/12/2014    Procedure: LOWER ENDOSCOPIC ULTRASOUND (EUS);  Surgeon: Milus Banister, MD;  Location: [facility];  Service: Endoscopy;  Laterality: N/A;  . Xi robotic assisted lower anterior resection N/A 03/19/2015    Procedure: XI ROBOTIC ASSISTED ABDOMINAL PERITONEAL RESECTION;  Surgeon: [name], MD;  Location: [facility] ORS;  Service: General;  Laterality: N/A;    Social History   Social History  . Marital Status: Widowed    Spouse Name: N/A  .  Number of Children: N/A  . Years of Education: N/A   Occupational History  . retired    Social History Main Topics  . Smoking status: Light Tobacco Smoker    Types: Cigars  . Smokeless tobacco: Never Used  . Alcohol Use: No  . Drug Use: No  . Sexual Activity: Not on file   Other Topics Concern  . Not on file   Social History Narrative    History reviewed. No pertinent family history.  Current Facility-Administered Medications  Medication Dose Route Frequency Provider Last Rate Last Dose  . antiseptic oral rinse (CPC / CETYLPYRIDINIUM CHLORIDE 0.05%) solution 7 mL  7 mL Mouth Rinse q12n4p Stark Klein, MD   7 mL at 03/22/15 1312  . chlorhexidine (PERIDEX) 0.12 % solution 15 mL  15 mL Mouth Rinse BID Stark Klein, MD   15 mL at 03/23/15 2101  . enoxaparin (LOVENOX) injection 40 mg  40 mg Subcutaneous [id number] [name], MD   40 mg at 03/23/15 0824  . ferrous sulfate tablet 325 mg  325 mg Oral BID WC Jackolyn Confer, MD   325 mg at 03/23/15 1759  . morphine 2 MG/ML injection 2-4 mg  2-4 mg Intravenous R6E PRN [name], MD      . ondansetron [facility]) injection 4 mg  4 mg Intravenous A5W PRN [name], MD      . oxyCODONE-acetaminophen (PERCOCET/ROXICET) 5-325 MG per tablet 1-2 tablet  1-2 tablet Oral U9W PRN [name], MD   1 tablet at 03/21/15 2141     No Known Allergies  Disposition: 01-Home or Self Care     Medication List    ASK your doctor about these medications        acetaminophen 325 MG  tablet  Commonly known as:  TYLENOL  Take 650 mg by mouth every 6 (six) hours as needed for moderate pain.     [employer] ANTI-DIARRHEA PO  Take 1 tablet by mouth daily as needed (diarrhea).     EX-LAX MAXIMUM STRENGTH 25 MG Tabs  Generic drug:  Sennosides  Take 1 tablet by mouth daily as needed (constipation).     lidocaine-prilocaine cream  Commonly known as:  EMLA  APPLY QUARTER SIZE TO PORT SITE ONE HOUR PRIOR TO CHEMO. DO NOT RUB IN. COVER WITH PLASTIC WRAP.     LORazepam 1 MG tablet  Commonly known as:  ATIVAN  Take 1 mg by mouth daily as needed for anxiety or sleep (Take at bedtime).     ondansetron 8 MG tablet  Commonly known as:  ZOFRAN  Take 1 tablet (8 mg total) by mouth every 8 (eight) hours as needed for nausea or vomiting.     rosuvastatin 20 MG tablet  Commonly known as:  CRESTOR  Take 20 mg by mouth daily.           Follow-up Information    Follow up with Rosario Adie., MD. Schedule an appointment as soon as possible for a visit in 2 weeks.   Specialty:  General Surgery   Contact information:   [address] [place] [address] [phone]       Follow up with [facility].   Why:  nurse to assist with ostomy care, physical therapy   Contact information:   [address] [place] [address] [phone]        Signed: Morton Peters, M.D., F.A.C.S. Gastrointestinal [facility]ally [facility] [address], [place], [place] [address] [phone] Main / Paging   03/24/2015, 6:49 AM

## 2015-03-24 NOTE — Progress Notes (Signed)
Patient alert and oriented. Ate a late lunch and tolerated well. Patient pain level tolerable (2/10), only sore at surgical sites. Patient able to demonstrate how to empty ostomy pouch and verbalized importance of emptying and measuring output from JP drain. Patient educated on diet. Patient given prescription for medication and educated on option OTC available for use for pain.

## 2015-03-24 NOTE — Progress Notes (Signed)
Physical Therapy Treatment Patient Details Name: FARHAD BURLESON MRN: [id number] DOB: [birth year] Today's Date: 03/24/2015    History of Present Illness 80 yo male adm with rectal mass/CA--> s/p ROBOTIC ASSISTED ABDOMINAL PERITONEAL RESECTION    PT Comments    Progressing very well with mobility. Pt reports Min pain to be tolerable. Tolerated distance well.   Follow Up Recommendations  No PT follow up;Supervision - Intermittent     Equipment Recommendations  None recommended by PT    Recommendations for Other Services       Precautions / Restrictions Precautions Precaution Comments: drain R side Restrictions Weight Bearing Restrictions: No    Mobility  Bed Mobility Overal bed mobility: Modified Independent             General bed mobility comments: HOB elevated. Increased time.  Transfers Overall transfer level: Modified independent   Transfers: Sit to/from Stand Sit to Stand: Modified independent (Device/Increase time)         General transfer comment: increased time.   Ambulation/Gait Ambulation/Gait assistance: Supervision Ambulation Distance (Feet): 500 Feet Assistive device: None Gait Pattern/deviations: Step-through pattern;Drifts right/left     General Gait Details: supervision for safety. slightly unsteady at times but no overt LOB   Financial trader Rankin (Stroke Patients Only)       Balance                                    Cognition Arousal/Alertness: Awake/alert Behavior During Therapy: WFL for tasks assessed/performed Overall Cognitive Status: Within Functional Limits for tasks assessed                      Exercises      General Comments        Pertinent Vitals/Pain Pain Assessment: Faces Faces Pain Scale: Hurts a little bit Pain Location: lower abd Pain Descriptors / Indicators: Sore;Tender Pain Intervention(s): Monitored during session     Home Living                      Prior Function            PT Goals (current goals can now be found in the care plan section) Progress towards PT goals: Progressing toward goals    Frequency  Min 3X/week    PT Plan Current plan remains appropriate    Co-evaluation             End of Session   Activity Tolerance: Patient tolerated treatment well Patient left: in bed;with call bell/phone within reach     Time: 1884-1660 PT Time Calculation (min) (ACUTE ONLY): 10 min  Charges:  $Gait Training: 8-22 mins                    G Codes:      Weston Anna, MPT Pager: [phone]

## 2015-03-24 NOTE — Consult Note (Signed)
WOC ostomy follow up Stoma type/location: LUQ Colostomy Stomal assessment/size: Slightly smaller than 1 and 5/8 inches oval (decrease in size/edema) Peristomal assessment: Intact, clear Treatment options for stomal/peristomal skin: skin barrier ring Output scant serosanguinous in pouch at this time. RN reports small amount earlier today. Ostomy pouching: 2pc. 2 and 1/4 inch pouching system Education provided: Patient, daughter-in-law and son are participating in today's session. Pouch removal, stoma sizing, pouch preparation, pouch application and pouch emptying reviewed. Patient able to perform pouch closure and simulate emptying and cleaning bottom of pouch with toilet paper x2. Educational booklet reviewed, also 1 page teaching tip sheet. Family asking appropriate questions about diet and resumption of activity. Patient taught to avoid lifting anything heavier than 10# for at least several weeks. Patient has not been eating well here in hospital and has not had much of anything today.  Lunch is ordered at the conclusion of my visit as he had little for breakfast and no lunch.  Enrolled patient in [place] Start Discharge program: Yes [facility] nursing team will not follow, but will remain available to this patient, the nursing and medical team.  Please re-consult if needed. Thanks, Maudie Flakes, MSN, RN, [place], [place], [place] [phone])

## 2015-03-26 LAB — TYPE AND SCREEN
ABO/RH(D): B POS
ANTIBODY SCREEN: NEGATIVE
Unit division: 0
Unit division: 0

## 2015-04-24 ENCOUNTER — Other Ambulatory Visit (HOSPITAL_COMMUNITY): Payer: Medicare Other

## 2015-04-24 ENCOUNTER — Ambulatory Visit (HOSPITAL_COMMUNITY): Payer: Medicare Other | Admitting: Oncology

## 2015-04-25 ENCOUNTER — Encounter (HOSPITAL_COMMUNITY): Payer: Medicare Other | Attending: Hematology & Oncology | Admitting: Oncology

## 2015-04-25 ENCOUNTER — Encounter (HOSPITAL_COMMUNITY): Payer: Medicare Other

## 2015-04-25 VITALS — BP 117/67 | HR 93 | Temp 98.2°F | Resp 16 | Wt 134.0 lb

## 2015-04-25 DIAGNOSIS — C20 Malignant neoplasm of rectum: Secondary | ICD-10-CM | POA: Insufficient documentation

## 2015-04-25 LAB — COMPREHENSIVE METABOLIC PANEL
ALT: 14 U/L — ABNORMAL LOW (ref 17–63)
ANION GAP: 10 (ref 5–15)
AST: 19 U/L (ref 15–41)
Albumin: 3.1 g/dL — ABNORMAL LOW (ref 3.5–5.0)
Alkaline Phosphatase: 72 U/L (ref 38–126)
BILIRUBIN TOTAL: 0.5 mg/dL (ref 0.3–1.2)
BUN: 22 mg/dL — ABNORMAL HIGH (ref 6–20)
CO2: 27 mmol/L (ref 22–32)
Calcium: 8.9 mg/dL (ref 8.9–10.3)
Chloride: 102 mmol/L (ref 101–111)
Creatinine, Ser: 0.77 mg/dL (ref 0.61–1.24)
GFR calc Af Amer: >60 mL/min (ref >60)
Glucose, Bld: 81 mg/dL (ref 65–99)
POTASSIUM: 4 mmol/L (ref 3.5–5.1)
Sodium: 139 mmol/L (ref 135–145)
TOTAL PROTEIN: 6.7 g/dL (ref 6.5–8.1)

## 2015-04-25 LAB — CBC WITH DIFFERENTIAL/PLATELET
BASOS PCT: 0 %
Basophils Absolute: 0 10*3/uL (ref 0.0–0.1)
Eosinophils Absolute: 0.1 10*3/uL (ref 0.0–0.7)
Eosinophils Relative: 1 %
HEMATOCRIT: 31.5 % — AB (ref 39.0–52.0)
Hemoglobin: 10.1 g/dL — ABNORMAL LOW (ref 13.0–17.0)
Lymphocytes Relative: 17 %
Lymphs Abs: 1.1 10*3/uL (ref 0.7–4.0)
MCH: 27.1 pg (ref 26.0–34.0)
MCHC: 32.1 g/dL (ref 30.0–36.0)
MCV: 84.5 fL (ref 78.0–100.0)
MONO ABS: 0.8 10*3/uL (ref 0.1–1.0)
MONOS PCT: 12 %
NEUTROS ABS: 4.3 10*3/uL (ref 1.7–7.7)
Neutrophils Relative %: 70 %
Platelets: 280 10*3/uL (ref 150–400)
RBC: 3.73 MIL/uL — ABNORMAL LOW (ref 4.22–5.81)
RDW: 14.1 % (ref 11.5–15.5)
WBC: 6.3 10*3/uL (ref 4.0–10.5)

## 2015-04-25 MED ORDER — HEPARIN SOD (PORK) LOCK FLUSH 100 UNIT/ML IV SOLN
500.0000 [IU] | Freq: Once | INTRAVENOUS | Status: AC
  Administered 2015-04-25: 500 [IU] via INTRAVENOUS

## 2015-04-25 MED ORDER — CAPECITABINE 150 MG PO TABS: ORAL_TABLET | ORAL | Status: AC

## 2015-04-25 MED ORDER — SODIUM CHLORIDE 0.9 % IJ SOLN
10.0000 mL | INTRAMUSCULAR | Status: DC | PRN
  Administered 2015-04-25: 10 mL via INTRAVENOUS
  Filled 2015-04-25: qty 10

## 2015-04-25 MED ORDER — CAPECITABINE 500 MG PO TABS: ORAL_TABLET | ORAL | Status: AC

## 2015-04-25 MED ORDER — HEPARIN SOD (PORK) LOCK FLUSH 100 UNIT/ML IV SOLN
INTRAVENOUS | Status: AC
  Filled 2015-04-25: qty 5

## 2015-04-25 NOTE — Progress Notes (Signed)
Brad Hampshire, MD [place] [address]  Rectal cancer - very distal ypT2, pN0, cM0 s/p APR & colostomy 03/19/2015 - Plan: Schedule Portacath Flush Appointment, heparin lock flush 100 unit/mL, sodium chloride 0.9 % injection 10 mL, capecitabine (XELODA) 500 MG tablet, capecitabine (XELODA) 150 MG tablet  Rectal cancer ([place]) - Plan: CBC with Differential, Comprehensive metabolic panel  CURRENT THERAPY: Consideration for adjuvant therapy.  INTERVAL HISTORY: Brad Sanchez 80 y.o. male returns for followup of Stage IIIB Rectal Cancer (T3N1a) S/P concomitant chemoradiation with 5FU CI, followed by definitive surgery by Dr. [name] on 0000000 demonstrating a pathologic Stage I disease with 0/12 lymph nodes involved and a T2 tumor.    Rectal cancer - very distal ypT2, pN0, cM0 s/p APR & colostomy 03/19/2015   11/13/2014 Imaging CT CAP- Bulky annular soft tissue mass involving the rectum measuring approximately 5.6 x 5.5 x 6.5 cm, consistent with newly diagnosed rectal carcinoma. No evidence of pelvic lymphadenopathy or distant metastatic disease.   11/13/2014 Initial Diagnosis Diagnosis Rectum, biopsy, rectal mass - ADENOCARCINOMA.   11/13/2014 Tumor Marker CEA 0.9   12/12/2014 Imaging EUS- 5cm, nearly circumferential uT3N1 (stage IIIb) rectal adenocarcinoma with distal edge located 1cm from internal anal verge   12/24/2014 - 01/31/2015 Chemotherapy 5-FU   12/24/2014 - 01/31/2015 Radiation Therapy    03/19/2015 Definitive Surgery ROBOTIC ASSISTED ABDOMINAL PERITONEAL RESECTION, by Dr. [name]   0000000 Pathology Results Colon, segmental resection for tumor, rectum and anal canal, additional proximal margin - RESIDUAL INVASIVE ADENOCARCINOMA, MODERATELY DIFFERENTIATED, SPANNING 4.1 CM. - TUMOR INVADES INTO, BUT NOT THROUGH MUSCULARIS PROPRIA. - RESECTION MARGINS ARE NE    I personally reviewed and went over pathology results with the patient.  He has  a colostomy now.  He notes that he is managing maintenance of this ostomy.   He denies any complaints today.  He is unaccompanied.  We discussed adjuvant treatment to reduce the riusk of recurrent disease.  "I don't want any more cutting."  He is educated on adjuvant treatment.  He would be a candidate for Xeloda therapy x 6 months.  He is agreeable to "pills."  We discussed Xeloda therapy in a 7 day on and 7 day off fashion.  He is agreeable to this.  We discussed the risks, benefits, alternatives, and side effects of therapy.   Past Medical History  Diagnosis Date  . High cholesterol   . Anxiety   . Heart murmur     Dr. Emilee Hero told him 15 years ago  . Headache   . Cancer [facility])     rectal    has Rotator cuff syndrome of right shoulder; Rectal cancer - very distal ypT2, pN0, cM0 s/p APR & colostomy 03/19/2015; and Colostomy in place - permanent end colostomy on his problem list.     has No Known Allergies.  Current Outpatient Prescriptions on File Prior to Visit  Medication Sig Dispense Refill  . acetaminophen (TYLENOL) 325 MG tablet Take 650 mg by mouth every 6 (six) hours as needed for moderate pain.     Marland Kitchen lidocaine-prilocaine (EMLA) cream APPLY QUARTER SIZE TO PORT SITE ONE HOUR PRIOR TO CHEMO. DO NOT RUB IN. COVER WITH PLASTIC WRAP. 30 g 6  . LORazepam (ATIVAN) 1 MG tablet Take 1 mg by mouth daily as needed for anxiety or sleep (Take at bedtime).     Marland Kitchen oxyCODONE (OXY IR/ROXICODONE) 5 MG immediate release tablet Take 1-2 tablets (  5-10 mg total) by mouth every 4 (four) hours as needed for moderate pain, severe pain or breakthrough pain. 40 tablet 0  . rosuvastatin (CRESTOR) 20 MG tablet Take 20 mg by mouth daily.      . Loperamide HCl ([employer] ANTI-DIARRHEA PO) Take 1 tablet by mouth daily as needed (diarrhea).    . ondansetron (ZOFRAN) 8 MG tablet Take 1 tablet (8 mg total) by mouth every 8 (eight) hours as needed for nausea or vomiting. (Patient not taking: Reported on 03/13/2015) 30  tablet 1  . Sennosides (EX-LAX MAXIMUM STRENGTH) 25 MG TABS Take 1 tablet by mouth daily as needed (constipation).     No current facility-administered medications on file prior to visit.    Past Surgical History  Procedure Laterality Date  . Colonoscopy N/A 11/13/2014    Procedure: COLONOSCOPY;  Surgeon: Rogene Houston, MD;  Location: [facility];  Service: Endoscopy;  Laterality: N/A;  730  . Eus N/A 12/12/2014    Procedure: LOWER ENDOSCOPIC ULTRASOUND (EUS);  Surgeon: Milus Banister, MD;  Location: [facility];  Service: Endoscopy;  Laterality: N/A;  . Xi robotic assisted lower anterior resection N/A 03/19/2015    Procedure: XI ROBOTIC ASSISTED ABDOMINAL PERITONEAL RESECTION;  Surgeon: [name], MD;  Location: [facility];  Service: General;  Laterality: N/A;    Denies any headaches, dizziness, double vision, fevers, chills, night sweats, nausea, vomiting, diarrhea, constipation, chest pain, heart palpitations, shortness of breath, blood in stool, black tarry stool, urinary pain, urinary burning, urinary frequency, hematuria.   PHYSICAL EXAMINATION  ECOG PERFORMANCE STATUS: 1 - Symptomatic but completely ambulatory  Filed Vitals:   04/25/15 1522  BP: 117/67  Pulse: 93  Temp: 98.2 F (36.8 C)  Resp: 16    GENERAL:alert, no distress, well nourished, well developed, comfortable, cooperative, smiling and unaccompanied today. SKIN: skin color, texture, turgor are normal, no rashes or significant lesions HEAD: Normocephalic, No masses, lesions, tenderness or abnormalities EYES: normal, PERRLA, EOMI, Conjunctiva are pink and non-injected EARS: External ears normal OROPHARYNX:lips, buccal mucosa, and tongue normal and mucous membranes are moist  NECK: supple, trachea midline LYMPH:  no palpable lymphadenopathy BREAST:not examined LUNGS: clear to auscultation  HEART: regular rate & rhythm, no murmurs and no gallops ABDOMEN:abdomen soft, non-tender, normal bowel sounds and  colostomy producing semi formed stools without blood and not dark in color. BACK: Back symmetric, no curvature., No CVA tenderness EXTREMITIES:less then 2 second capillary refill, no joint deformities, effusion, or inflammation, no skin discoloration, no clubbing, no cyanosis  NEURO: alert & oriented x 3 with fluent speech, no focal motor/sensory deficits, gait normal   LABORATORY DATA: CBC    Component Value Date/Time   WBC 6.3 04/25/2015 1530   RBC 3.73* 04/25/2015 1530   HGB 10.1* 04/25/2015 1530   HCT 31.5* 04/25/2015 1530   PLT 280 04/25/2015 1530   MCV 84.5 04/25/2015 1530   MCH 27.1 04/25/2015 1530   MCHC 32.1 04/25/2015 1530   RDW 14.1 04/25/2015 1530   LYMPHSABS 1.1 04/25/2015 1530   MONOABS 0.8 04/25/2015 1530   EOSABS 0.1 04/25/2015 1530   BASOSABS 0.0 04/25/2015 1530      Chemistry      Component Value Date/Time   NA 139 04/25/2015 1530   K 4.0 04/25/2015 1530   CL 102 04/25/2015 1530   CO2 27 04/25/2015 1530   BUN 22* 04/25/2015 1530   CREATININE 0.77 04/25/2015 1530      Component Value Date/Time   CALCIUM 8.9  04/25/2015 1530   ALKPHOS 72 04/25/2015 1530   AST 19 04/25/2015 1530   ALT 14* 04/25/2015 1530   BILITOT 0.5 04/25/2015 1530     Lab Results  Component Value Date   CEA 0.9 11/13/2014     PENDING LABS:   RADIOGRAPHIC STUDIES:  No results found.   PATHOLOGY:    ASSESSMENT AND PLAN:  Rectal cancer - very distal ypT2, pN0, cM0 s/p APR & colostomy 03/19/2015 Stage IIIB Rectal Cancer (T3N1a) S/P concomitant chemoradiation with 5FU CI, followed by definitive surgery by Dr. [name] on 0000000 demonstrating a pathologic Stage I disease with 0/12 lymph nodes involved and a T2 tumor.  Oncology history is updated.   Based upon his Stage III disease the time of Dx, he would benefit from adjuvant therapy with Xeloda to reduce the risk of recurrence.  Rx is printed for Xeloda 1000 mg/m2 BID days 1-7 every 14 days; this equates to a  dose of 1650 mg BID 7 days on and 7 days off.  He will need chemotherapy teaching and I have messaged our Nurse Navigator about this.  I will defer scheduling of future appointments to her.  He will need to start the medication ASAP as his surgery date was 03/19/2015.     THERAPY PLAN:  He would benefit from adjuvant therapy.  Xeloda will likely be the most tolerable and he is agreeable to "pill" treatment.  He was unaccompanied by any family members and therefore, I am not sure he fully understands adjuvant treatment as he is mildly demented.  I will get him set-up for chemotherapy teaching and attendance of a family member is required.  All questions were answered. The patient knows to call the clinic with any problems, questions or concerns. We can certainly see the patient much sooner if necessary.  Patient and plan discussed with Dr. Ancil Linsey and she is in agreement with the aforementioned.   This note is electronically signed by: Doy Mince 04/25/2015 8:22 PM

## 2015-04-25 NOTE — Progress Notes (Signed)
Brad Sanchez presented for labwork. Labs per MD order drawn via Portacath located in the right chest wall accessed with  H 20 needle. Good blood return present. Procedure without incident.  Needle removed intact. Patient tolerated procedure well.

## 2015-04-25 NOTE — Progress Notes (Signed)
See office visit encounter. 

## 2015-04-25 NOTE — Assessment & Plan Note (Addendum)
Stage IIIB Rectal Cancer (T3N1a) S/P concomitant chemoradiation with 5FU CI, followed by definitive surgery by Dr. [name] on 0000000 demonstrating a pathologic Stage I disease with 0/12 lymph nodes involved and a T2 tumor.  Oncology history is updated.   Based upon his Stage III disease the time of Dx, he would benefit from adjuvant therapy with Xeloda to reduce the risk of recurrence.  Rx is printed for Xeloda 1000 mg/m2 BID days 1-7 every 14 days; this equates to a dose of 1650 mg BID 7 days on and 7 days off.  He will need chemotherapy teaching and I have messaged our Nurse Navigator about this.  I will defer scheduling of future appointments to her.  He will need to start the medication ASAP as his surgery date was 03/19/2015.

## 2015-04-25 NOTE — Patient Instructions (Signed)
[place] at [facility] Discharge Instructions  RECOMMENDATIONS MADE BY THE CONSULTANT AND ANY TEST RESULTS WILL BE SENT TO YOUR REFERRING PHYSICIAN.  Discussion by Robynn Pane, PA-C Will get a prescription for xeloda and once we get authorization we will get you back for chemotherapy teaching with Lupita Raider.  She will call you with day and time of the appointment.  Will let you know about future appointments once we know what we are going to do.  Thank you for choosing [place] at [facility] to provide your oncology and hematology care.  To afford each patient quality time with our provider, please arrive at least 15 minutes before your scheduled appointment time.    You need to re-schedule your appointment should you arrive 10 or more minutes late.  We strive to give you quality time with our providers, and arriving late affects you and other patients whose appointments are after yours.  Also, if you no show three or more times for appointments you may be dismissed from the clinic at the providers discretion.     Again, thank you for choosing [facility].  Our hope is that these requests will decrease the amount of time that you wait before being seen by our physicians.       _____________________________________________________________  Should you have questions after your visit to [facility], please contact our office at [phone] between the hours of 8:30 a.m. and 4:30 p.m.  Voicemails left after 4:30 p.m. will not be returned until the following business day.  For prescription refill requests, have your pharmacy contact our office.

## 2015-05-05 NOTE — Patient Instructions (Addendum)
[place]   CHEMOTHERAPY INSTRUCTIONS  Xeloda - diarrhea, hand-foot syndrome (hands/feet can get red/tender/and skin can peel). Avoid friction and hot environments on hands/feet. Wear cotton socks. Lotion twice a day to hands/fingers/feet/toes with Udder cream. Mucositis (inflammation of any mucosal membrane can develop -this can occur in the throat/mouth). Mouth sores, nausea/vomiting, anemia, fatigue can also develop. Take Imodium if diarrhea develops and contact us immediately - we will give you further instructions on how to take your Imodium. Xeloda usually comes with a teaching packet from the mail order/specialty pharmacy that will supply you with the drug that is really informative about diarrhea and hand-foot syndrome. No pregnant, child bearing age people, or animals should come into contact with this drug. Even though this is in pill form - it is still very powerful!!! If you should be instructed to discontinue taking this drug, bring the drug into the [facility] and we will dispose of it in a safe manner. Chemotherapy is a biohazard and must be disposed of properly. Do not touch this pill much. It would be best if the caregiver wore gloves while handling. Do not put this pill in with the rest of your pills in a pill box. Keep them in a separate pill box.   Xeloda 500mg . Take 3 tablets in the am and 3 tablets in the pm for 7 days in a row. Then you will have a 7 day rest period where you don't take any pills.  Xeloda 150mg . Take 1 tablet in the am and 1 tablet in the pm for 7 days in a row. Then you will have a 7 day rest period where you don't take any pills.  Your total dosage of Xeloda is 1650mg  in the am and 1650mg  in the pm for 7 days in a row. Take within 30 minutes of eating.    POTENTIAL SIDE EFFECTS OF TREATMENT: Increased Susceptibility to Infection, Vomiting, Constipation, Hair Thinning, Changes in Character of Skin and Nails (brittleness,  dryness,etc.), Bone Marrow Suppression, Nausea, Diarrhea, Sun Sensitivity and Mouth Sores   EDUCATIONAL MATERIALS GIVEN AND REVIEWED: Chemotherapy and You booklet Specific Instructions Sheets: Xeloda   SELF CARE ACTIVITIES WHILE ON CHEMOTHERAPY: Increase your fluid intake 48 hours prior to treatment and drink at least 2 quarts per day after treatment., No alcohol intake., No aspirin or other medications unless approved by your oncologist., Eat foods that are light and easy to digest., Eat foods at cold or room temperature., No fried, fatty, or spicy foods immediately before or after treatment., Have teeth cleaned professionally before starting treatment. Keep dentures and partial plates clean., Use soft toothbrush and do not use mouthwashes that contain alcohol. Biotene is a good mouthwash that is available at most pharmacies or may be ordered by calling [phone]., Use warm salt water gargles (1 teaspoon salt per 1 quart warm water) before and after meals and at bedtime. Or you may rinse with 2 tablespoons of three -percent hydrogen peroxide mixed in eight ounces of water., Always use sunscreen with SPF (Sun Protection Factor) of 30 or higher., Use your nausea medication as directed to prevent nausea., Use your stool softener or laxative as directed to prevent constipation. and Use your anti-diarrheal medication as directed to stop diarrhea.  Please wash your hands for at least 30 seconds using warm soapy water. Handwashing is the #1 way to prevent the spread of germs. Stay away from sick people or people who are getting over a cold.  If you develop respiratory systems such as green/yellow mucus production or productive cough or persistent cough let us know and we will see if you need an antibiotic. It is a good idea to keep a pair of gloves on when going into grocery stores/[employer] to decrease your risk of coming into contact with germs on the carts, etc. Carry alcohol hand gel with you at all times  and use it frequently if out in public. All foods need to be cooked thoroughly. No raw foods. No medium or undercooked meats, eggs. If your food is cooked medium well, it does not need to be hot pink or saturated with bloody liquid at all. Vegetables and fruits need to be washed/rinsed under the faucet with a dish detergent before being consumed. You can eat raw fruits and vegetables unless we tell you otherwise but it would be best if you cooked them or bought frozen. Do not eat off of salad bars or hot bars unless you really trust the cleanliness of the restaurant. If you need dental work, please let Dr. Whitney Muse know before you go for your appointment so that we can coordinate the best possible time for you in regards to your chemo regimen. You need to also let your dentist know that you are actively taking chemo. We may need to do labs prior to your dental appointment. We also want your bowels moving at least every other day. If this is not happening, we need to know so that we can get you on a bowel regimen to help you go.     MEDICATIONS: You have been given prescriptions for the following medications:  Zofran/Ondansetron 8mg  tablet. May take 1 tablet every 8 hours as needed for nausea/vomiting.   Xeloda 500mg . Take 3 tablets in the am and 3 tablets in the pm for 7 days in a row. Then you will have a 7 day rest period where you don't take any pills.  Xeloda 150mg . Take 1 tablet in the am and 1 tablet in the pm for 7 days in a row. Then you will have a 7 day rest period where you don't take any pills.  Your total dosage of Xeloda is 1650mg  in the am and 1650mg  in the pm for 7 days in a row. Take within 30 minutes of eating.    Over-the-Counter Meds:  Miralax 1 capful in 8 oz of fluid daily. May increase to two times a day if needed. This is a stool softener. If this doesn't work proceed you can add:  Senokot S  - start with 1 tablet two times a day and increase to 4 tablets two times a day if  needed. (total of 8 tablets in a 24 hour period). This is a stimulant laxative.   Call us if this does not help your bowels move.   Imodium 2mg  capsule. Take 2 capsules after the 1st loose stool and then 1 capsule every 2 hours until you go a total of 12 hours without having a loose stool. Call the [facility] if loose stools continue. If diarrhea occurs @ bedtime, take 2 capsules @ bedtime. Then take 2 capsules every 4 hours until morning. Call [facility].   SYMPTOMS TO REPORT AS SOON AS POSSIBLE AFTER TREATMENT:  FEVER GREATER THAN 100.5 F  CHILLS WITH OR WITHOUT FEVER  NAUSEA AND VOMITING THAT IS NOT CONTROLLED WITH YOUR NAUSEA MEDICATION  UNUSUAL SHORTNESS OF BREATH  UNUSUAL BRUISING OR BLEEDING  TENDERNESS IN MOUTH AND THROAT WITH  OR WITHOUT PRESENCE OF ULCERS  URINARY PROBLEMS  BOWEL PROBLEMS  UNUSUAL RASH    Wear comfortable clothing and clothing appropriate for easy access to any Portacath or PICC line. Let us know if there is anything that we can do to make your therapy better!      I have been informed and understand all of the instructions given to me and have received a copy. I have been instructed to call the clinic [phone] or my family physician as soon as possible for continued medical care, if indicated. I do not have any more questions at this time but understand that I may call the [facility] or the Patient Navigator at [phone] during office hours should I have questions or need assistance in obtaining follow-up care.           Capecitabine tablets What is this medicine? CAPECITABINE (ka pe SITE a been) is a chemotherapy drug. It slows the growth of cancer cells. This medicine is used to treat breast cancer, and also colon or rectal cancer. This medicine may be used for other purposes; ask your health care provider or pharmacist if you have questions. What should I tell my health care provider before I take this medicine? They  need to know if you have any of these conditions: -bleeding or blood disorders -dihydropyrimidine dehydrogenase (DPD) deficiency -heart disease -infection (especially a virus infection such as chickenpox, cold sores, or herpes) -kidney disease -liver disease -an unusual or allergic reaction to capecitabine, 5-fluorouracil, other medicines, foods, dyes, or preservatives -pregnant or trying to get pregnant -breast-feeding How should I use this medicine? Take this medicine by mouth with a glass of water, within 30 minutes of the end of a meal. Do not cut, crush or chew this medicine. Follow the directions on the prescription label. Take your medicine at regular intervals. Do not take it more often than directed. Do not stop taking except on your doctor's advice. Your doctor may want you to take a combination of 150 mg and 500 mg tablets for each dose. It is very important that you know how to correctly take your dose. Taking the wrong tablets could result in an overdose (too much medication) or underdose (too little medication). Talk to your pediatrician regarding the use of this medicine in children. Special care may be needed. Overdosage: If you think you have taken too much of this medicine contact a poison control center or emergency room at once. NOTE: This medicine is only for you. Do not share this medicine with others. What if I miss a dose? If you miss a dose, do not take the missed dose at all. Do not take double or extra doses. Instead, continue with your next scheduled dose and check with your doctor. What may interact with this medicine? -antacids with aluminum and/or magnesium -folic acid -leucovorin -medicines to increase blood counts like filgrastim, pegfilgrastim, sargramostim -phenytoin -vaccines -warfarin Talk to your doctor or health care professional before taking any of these medicines: -acetaminophen -aspirin -ibuprofen -ketoprofen -naproxen This list may not  describe all possible interactions. Give your health care provider a list of all the medicines, herbs, non-prescription drugs, or dietary supplements you use. Also tell them if you smoke, drink alcohol, or use illegal drugs. Some items may interact with your medicine. What should I watch for while using this medicine? Visit your doctor for checks on your progress. This drug may make you feel generally unwell. This is not uncommon, as chemotherapy can  affect healthy cells as well as cancer cells. Report any side effects. Continue your course of treatment even though you feel ill unless your doctor tells you to stop. In some cases, you may be given additional medicines to help with side effects. Follow all directions for their use. Call your doctor or health care professional for advice if you get a fever, chills or sore throat, or other symptoms of a cold or flu. Do not treat yourself. This drug decreases your body's ability to fight infections. Try to avoid being around people who are sick. This medicine may increase your risk to bruise or bleed. Call your doctor or health care professional if you notice any unusual bleeding. Be careful brushing and flossing your teeth or using a toothpick because you may get an infection or bleed more easily. If you have any dental work done, tell your dentist you are receiving this medicine. Avoid taking products that contain aspirin, acetaminophen, ibuprofen, naproxen, or ketoprofen unless instructed by your doctor. These medicines may hide a fever. Do not become pregnant while taking this medicine. Women should inform their doctor if they wish to become pregnant or think they might be pregnant. There is a potential for serious side effects to an unborn child. Talk to your health care professional or pharmacist for more information. Do not breast-feed an infant while taking this medicine. Men are advised not to father a child while taking this medicine. What side  effects may I notice from receiving this medicine? Side effects that you should report to your doctor or health care professional as soon as possible: -allergic reactions like skin rash, itching or hives, swelling of the face, lips, or tongue -low blood counts - this medicine may decrease the number of white blood cells, red blood cells and platelets. You may be at increased risk for infections and bleeding. -signs of infection - fever or chills, cough, sore throat, pain or difficulty passing urine -signs of decreased platelets or bleeding - bruising, pinpoint red spots on the skin, black, tarry stools, blood in the urine -signs of decreased red blood cells - unusually weak or tired, fainting spells, lightheadedness -breathing problems -changes in vision -chest pain -dark urine -diarrhea of more than 4 bowel movements in one day or any diarrhea at night; bloody or watery diarrhea -dizziness -mouth sores -nausea and vomiting -pain, tingling, numbness in the hands or feet -redness, swelling, or sores on hands or feet -stomach pain -vomiting -yellow color of skin or eyes Side effects that usually do not require medical attention (report to your doctor or health care professional if they continue or are bothersome): -constipation -diarrhea -dry or itchy skin -hair loss -loss of appetite -nausea -weak or tired This list may not describe all possible side effects. Call your doctor for medical advice about side effects. You may report side effects to FDA at 1-800-FDA-1088. Where should I keep my medicine? Keep out of the reach of children. Store at room temperature between 15 and 30 degrees C (59 and 86 degrees F). Keep container tightly closed. Throw away any unused medicine after the expiration date. NOTE: This sheet is a summary. It may not cover all possible information. If you have questions about this medicine, talk to your doctor, pharmacist, or health care provider.    2016,  Elsevier/Gold Standard. (2014-07-08 17:03:30)

## 2015-05-06 ENCOUNTER — Encounter (HOSPITAL_BASED_OUTPATIENT_CLINIC_OR_DEPARTMENT_OTHER): Payer: Medicare Other

## 2015-05-06 DIAGNOSIS — C20 Malignant neoplasm of rectum: Secondary | ICD-10-CM

## 2015-05-06 NOTE — Progress Notes (Signed)
Chemo teaching done and consent signed for Xeloda. Teaching done with patient and son Sherren Mocha. Will make calendar for patient when we know the delivery date of drug. Follow up appts in 2 weeks (mid Dec) given to patient today.

## 2015-05-13 ENCOUNTER — Other Ambulatory Visit (HOSPITAL_COMMUNITY): Payer: Self-pay

## 2015-05-13 DIAGNOSIS — C20 Malignant neoplasm of rectum: Secondary | ICD-10-CM

## 2015-05-21 ENCOUNTER — Other Ambulatory Visit (HOSPITAL_COMMUNITY): Payer: Medicare Other

## 2015-05-21 NOTE — Progress Notes (Signed)
-  NO SHow-

## 2015-05-21 NOTE — Assessment & Plan Note (Deleted)
Stage IIIB Rectal Cancer (T3N1a) S/P concomitant chemoradiation with 5FU CI, followed by definitive surgery by Dr. [name] on 0000000 demonstrating a pathologic Stage I disease with 0/12 lymph nodes involved and a T2 tumor.  Oncology history is updated.   Based upon his Stage III disease the time of Dx, he would benefit from adjuvant therapy with Xeloda to reduce the risk of recurrence.  Rx is printed for Xeloda 1000 mg/m2 BID days 1-7 every 14 days; this equates to a dose of 1650 mg BID 7 days on and 7 days off.  He will need chemotherapy teaching and I have messaged our Nurse Navigator about this.  I will defer scheduling of future appointments to her.  He will need to start the medication ASAP as his surgery date was 03/19/2015.

## 2015-05-22 ENCOUNTER — Ambulatory Visit (HOSPITAL_COMMUNITY): Payer: Medicare Other | Admitting: Oncology

## 2015-05-22 ENCOUNTER — Encounter (HOSPITAL_COMMUNITY): Payer: Medicare Other | Attending: Hematology & Oncology

## 2015-05-22 DIAGNOSIS — C20 Malignant neoplasm of rectum: Secondary | ICD-10-CM | POA: Insufficient documentation

## 2015-05-22 LAB — COMPREHENSIVE METABOLIC PANEL
ALT: 20 U/L (ref 17–63)
AST: 20 U/L (ref 15–41)
Albumin: 3.5 g/dL (ref 3.5–5.0)
Alkaline Phosphatase: 78 U/L (ref 38–126)
Anion gap: 8 (ref 5–15)
BILIRUBIN TOTAL: 0.3 mg/dL (ref 0.3–1.2)
BUN: 18 mg/dL (ref 6–20)
CHLORIDE: 104 mmol/L (ref 101–111)
CO2: 29 mmol/L (ref 22–32)
Calcium: 9.1 mg/dL (ref 8.9–10.3)
Creatinine, Ser: 0.82 mg/dL (ref 0.61–1.24)
Glucose, Bld: 106 mg/dL — ABNORMAL HIGH (ref 65–99)
POTASSIUM: 3.8 mmol/L (ref 3.5–5.1)
Sodium: 141 mmol/L (ref 135–145)
TOTAL PROTEIN: 7 g/dL (ref 6.5–8.1)

## 2015-05-22 LAB — CBC WITH DIFFERENTIAL/PLATELET
Basophils Absolute: 0 10*3/uL (ref 0.0–0.1)
Basophils Relative: 0 %
EOS PCT: 1 %
Eosinophils Absolute: 0.1 10*3/uL (ref 0.0–0.7)
HEMATOCRIT: 37.7 % — AB (ref 39.0–52.0)
Hemoglobin: 11.8 g/dL — ABNORMAL LOW (ref 13.0–17.0)
LYMPHS ABS: 1.5 10*3/uL (ref 0.7–4.0)
LYMPHS PCT: 23 %
MCH: 25.7 pg — AB (ref 26.0–34.0)
MCHC: 31.3 g/dL (ref 30.0–36.0)
MCV: 82 fL (ref 78.0–100.0)
MONO ABS: 0.6 10*3/uL (ref 0.1–1.0)
MONOS PCT: 9 %
NEUTROS ABS: 4.6 10*3/uL (ref 1.7–7.7)
Neutrophils Relative %: 67 %
PLATELETS: 225 10*3/uL (ref 150–400)
RBC: 4.6 MIL/uL (ref 4.22–5.81)
RDW: 14.2 % (ref 11.5–15.5)
WBC: 6.8 10*3/uL (ref 4.0–10.5)

## 2015-05-22 NOTE — Progress Notes (Signed)
LABS DRAWN

## 2015-05-25 NOTE — Progress Notes (Signed)
NO SHOW. This encounter was created in error - please disregard.

## 2015-05-25 NOTE — Assessment & Plan Note (Deleted)
Stage IIIB Rectal Cancer (T3N1a) S/P concomitant chemoradiation with 5FU CI, followed by definitive surgery by Dr. [name] on 0000000 demonstrating a pathologic Stage I disease with 0/12 lymph nodes involved and a T2 tumor.  Oncology history is updated.   Based upon his Stage III disease the time of Dx, he would benefit from adjuvant therapy with Xeloda to reduce the risk of recurrence.  Rx is printed for Xeloda 1000 mg/m2 BID days 1-7 every 14 days; this equates to a dose of 1650 mg BID 7 days on and 7 days off.  He will need chemotherapy teaching and I have messaged our Nurse Navigator about this.  I will defer scheduling of future appointments to her.  He will need to start the medication ASAP as his surgery date was 03/19/2015.

## 2015-05-26 ENCOUNTER — Ambulatory Visit (HOSPITAL_COMMUNITY): Payer: Medicare Other | Admitting: Oncology

## 2015-06-20 ENCOUNTER — Encounter (HOSPITAL_COMMUNITY): Payer: Medicare Other | Attending: Hematology & Oncology

## 2015-06-20 VITALS — BP 128/57 | HR 78 | Temp 98.5°F | Resp 16

## 2015-06-20 DIAGNOSIS — C20 Malignant neoplasm of rectum: Secondary | ICD-10-CM | POA: Diagnosis not present

## 2015-06-20 DIAGNOSIS — Z452 Encounter for adjustment and management of vascular access device: Secondary | ICD-10-CM

## 2015-06-20 DIAGNOSIS — Z95828 Presence of other vascular implants and grafts: Secondary | ICD-10-CM

## 2015-06-20 MED ORDER — SODIUM CHLORIDE 0.9 % IJ SOLN
10.0000 mL | Freq: Once | INTRAMUSCULAR | Status: AC
  Administered 2015-06-20: 10 mL via INTRAVENOUS

## 2015-06-20 MED ORDER — HEPARIN SOD (PORK) LOCK FLUSH 100 UNIT/ML IV SOLN
500.0000 [IU] | Freq: Once | INTRAVENOUS | Status: AC
  Administered 2015-06-20: 500 [IU] via INTRAVENOUS
  Filled 2015-06-20: qty 5

## 2015-06-20 NOTE — Progress Notes (Signed)
Brad Sanchez presented for Portacath access and flush. Proper placement of portacath confirmed by CXR. Portacath located right chest wall accessed with  H 20 needle. Good blood return present. Portacath flushed with 20ml NS and 500U/5ml Heparin and needle removed intact. Procedure without incident. Patient tolerated procedure well.   

## 2015-06-20 NOTE — Patient Instructions (Signed)
[facility] at [facility] Discharge Instructions  RECOMMENDATIONS MADE BY THE CONSULTANT AND ANY TEST RESULTS WILL BE SENT TO YOUR REFERRING PHYSICIAN.  Port flush today as scheduled. Return as scheduled.  Thank you for choosing [facility] at [facility] to provide your oncology and hematology care.  To afford each patient quality time with our provider, please arrive at least 15 minutes before your scheduled appointment time.    You need to re-schedule your appointment should you arrive 10 or more minutes late.  We strive to give you quality time with our providers, and arriving late affects you and other patients whose appointments are after yours.  Also, if you no show three or more times for appointments you may be dismissed from the clinic at the providers discretion.     Again, thank you for choosing [facility].  Our hope is that these requests will decrease the amount of time that you wait before being seen by our physicians.       _____________________________________________________________  Should you have questions after your visit to [facility], please contact our office at [phone] between the hours of 8:30 a.m. and 4:30 p.m.  Voicemails left after 4:30 p.m. will not be returned until the following business day.  For prescription refill requests, have your pharmacy contact our office.    

## 2015-08-01 ENCOUNTER — Encounter (HOSPITAL_COMMUNITY): Payer: Medicare Other | Attending: Hematology & Oncology

## 2015-08-01 VITALS — BP 114/70 | HR 93 | Temp 98.2°F | Resp 18

## 2015-08-01 DIAGNOSIS — Z452 Encounter for adjustment and management of vascular access device: Secondary | ICD-10-CM

## 2015-08-01 DIAGNOSIS — C20 Malignant neoplasm of rectum: Secondary | ICD-10-CM | POA: Diagnosis not present

## 2015-08-01 MED ORDER — SODIUM CHLORIDE 0.9% FLUSH
20.0000 mL | INTRAVENOUS | Status: DC | PRN
  Administered 2015-08-01: 20 mL via INTRAVENOUS
  Filled 2015-08-01: qty 20

## 2015-08-01 MED ORDER — HEPARIN SOD (PORK) LOCK FLUSH 100 UNIT/ML IV SOLN
500.0000 [IU] | Freq: Once | INTRAVENOUS | Status: AC
  Administered 2015-08-01: 500 [IU] via INTRAVENOUS
  Filled 2015-08-01: qty 5

## 2015-08-01 NOTE — Patient Instructions (Signed)
[place] at [facility] Discharge Instructions  RECOMMENDATIONS MADE BY THE CONSULTANT AND ANY TEST RESULTS WILL BE SENT TO YOUR REFERRING PHYSICIAN.  Port flush today. Please return in 8 weeks as scheduled for you port to be flushed.    Thank you for choosing [place] at [facility] to provide your oncology and hematology care.  To afford each patient quality time with our provider, please arrive at least 15 minutes before your scheduled appointment time.   Beginning January 23rd 2017 lab work for the [facility] will be done in the  Main lab at [facility] on 1st floor. If you have a lab appointment with the [facility] please come in thru the  Main Entrance and check in at the main information desk  You need to re-schedule your appointment should you arrive 10 or more minutes late.  We strive to give you quality time with our providers, and arriving late affects you and other patients whose appointments are after yours.  Also, if you no show three or more times for appointments you may be dismissed from the clinic at the providers discretion.     Again, thank you for choosing [facility].  Our hope is that these requests will decrease the amount of time that you wait before being seen by our physicians.       _____________________________________________________________  Should you have questions after your visit to [facility], please contact our office at [phone] between the hours of 8:30 a.m. and 4:30 p.m.  Voicemails left after 4:30 p.m. will not be returned until the following business day.  For prescription refill requests, have your pharmacy contact our office.

## 2015-08-01 NOTE — Progress Notes (Signed)
Brad Sanchez presented for Portacath access and flush. Proper placement of portacath confirmed by CXR. Portacath located right chest wall accessed with  H 20 needle. Good blood return present. Portacath flushed with 20ml NS and 500U/5ml Heparin and needle removed intact. Procedure without incident. Patient tolerated procedure well.   

## 2015-09-26 ENCOUNTER — Encounter (HOSPITAL_COMMUNITY): Payer: Medicare Other | Attending: Hematology & Oncology

## 2015-09-26 ENCOUNTER — Encounter (HOSPITAL_COMMUNITY): Payer: Self-pay

## 2015-09-26 DIAGNOSIS — C20 Malignant neoplasm of rectum: Secondary | ICD-10-CM | POA: Diagnosis not present

## 2015-09-26 DIAGNOSIS — Z452 Encounter for adjustment and management of vascular access device: Secondary | ICD-10-CM | POA: Diagnosis not present

## 2015-09-26 MED ORDER — SODIUM CHLORIDE 0.9% FLUSH
10.0000 mL | INTRAVENOUS | Status: DC | PRN
  Administered 2015-09-26: 10 mL via INTRAVENOUS
  Filled 2015-09-26: qty 10

## 2015-09-26 MED ORDER — HEPARIN SOD (PORK) LOCK FLUSH 100 UNIT/ML IV SOLN
500.0000 [IU] | Freq: Once | INTRAVENOUS | Status: DC
  Filled 2015-09-26: qty 5

## 2015-09-26 MED ORDER — SODIUM CHLORIDE 0.9% FLUSH: 10.0000 mL | INTRAVENOUS | Status: DC | PRN

## 2015-09-26 MED ORDER — HEPARIN SOD (PORK) LOCK FLUSH 100 UNIT/ML IV SOLN
500.0000 [IU] | Freq: Once | INTRAVENOUS | Status: AC
  Administered 2015-09-26: 500 [IU] via INTRAVENOUS

## 2015-09-26 NOTE — Progress Notes (Signed)
Brad Sanchez presented for Portacath access and flush. Portacath located right chest wall accessed with  H 20 needle. Good blood return present. Portacath flushed with 63ml NS and 500U/81ml Heparin and needle removed intact. Procedure without incident. Patient tolerated procedure well.

## 2015-09-26 NOTE — Patient Instructions (Signed)
[place] at [facility] Discharge Instructions  RECOMMENDATIONS MADE BY THE CONSULTANT AND ANY TEST RESULTS WILL BE SENT TO YOUR REFERRING PHYSICIAN.  Port flush done today. We set up other port flush appointments Call for any concerns or questions  Thank you for choosing [place] at [facility] to provide your oncology and hematology care.  To afford each patient quality time with our provider, please arrive at least 15 minutes before your scheduled appointment time.   Beginning January 23rd 2017 lab work for the [facility] will be done in the  Main lab at [facility] on 1st floor. If you have a lab appointment with the [facility] please come in thru the  Main Entrance and check in at the main information desk  You need to re-schedule your appointment should you arrive 10 or more minutes late.  We strive to give you quality time with our providers, and arriving late affects you and other patients whose appointments are after yours.  Also, if you no show three or more times for appointments you may be dismissed from the clinic at the providers discretion.     Again, thank you for choosing [facility].  Our hope is that these requests will decrease the amount of time that you wait before being seen by our physicians.       _____________________________________________________________  Should you have questions after your visit to [facility], please contact our office at [phone] between the hours of 8:30 a.m. and 4:30 p.m.  Voicemails left after 4:30 p.m. will not be returned until the following business day.  For prescription refill requests, have your pharmacy contact our office.         Resources For Cancer Patients and their Caregivers ? American Cancer Society: Can assist with transportation, wigs, general needs, runs Look Good Feel Better.        409-815-7831 ? Cancer Care: Provides  financial assistance, online support groups, medication/co-pay assistance.  1-800-813-HOPE (607)488-8776) ? [facility] Assists [place] cancer patients and their families through emotional , educational and financial support.  403 448 4873 ? [place] DSS Where to apply for food stamps, Medicaid and utility assistance. 425-382-5245 ? [facility]: Transportation to medical appointments. (308)196-0657 ? Social Security Administration: May apply for disability if have a Stage IV cancer. (539) 369-6568 671-041-4038 ? [employer], Disability and Transit Services: Assists with nutrition, care and transit needs. ([phone] [phone]

## 2015-11-21 ENCOUNTER — Encounter (HOSPITAL_COMMUNITY): Payer: Medicare Other

## 2016-01-16 ENCOUNTER — Encounter (HOSPITAL_COMMUNITY): Payer: Medicare Other

## 2016-02-04 IMAGING — US IR US GUIDE VASC ACCESS RIGHT
1 series · 1 of 1 positions shown · non-contrast
Comparison: none

CLINICAL DATA: Rectal cancer

[Series 1: ir fluoro/shunt/fist · 1 of 1 slices shown]
[im 1/1]
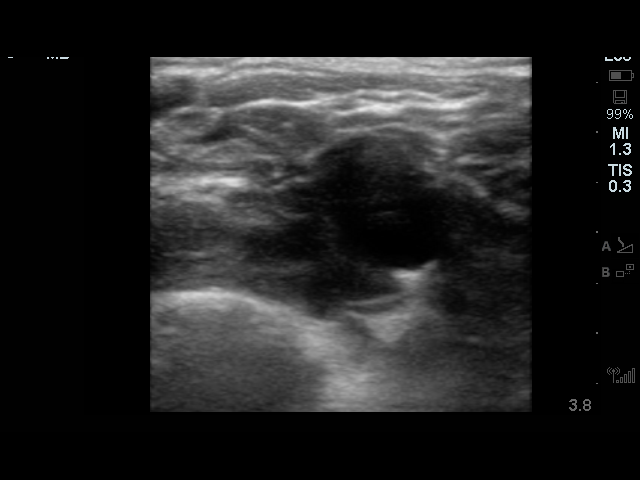

[1 of 1 positions shown; findings below may reference images not displayed]

EXAM:
TUNNEL POWER PORT PLACEMENT WITH SUBCUTANEOUS POCKET UTILIZING
ULTRASOUND & FLOUROSCOPY

FLUOROSCOPY TIME:  18 seconds.

MEDICATIONS AND MEDICAL HISTORY:
Versed 4 mg, Fentanyl  mcg.

As antibiotic prophylaxis, Ancef was ordered pre-procedure and
administered intravenously within one hour of incision.

ANESTHESIA/SEDATION:
Moderate sedation time: 24 minutes

CONTRAST:  None

PROCEDURE:
After written informed consent was obtained, patient was placed in
the supine position on angiographic table. The right neck and chest
was prepped and draped in a sterile fashion. Lidocaine was utilized
for local anesthesia. The right internal jugular vein was noted to
be patent initially with ultrasound. Under sonographic guidance, a
micropuncture needle was inserted into the right IJ vein (Ultrasound
and fluoroscopic image documentation was performed). The needle was
removed over an 018 wire which was exchanged for a Amplatz. This was
advanced into the IVC. An 8-French dilator was advanced over the
Amplatz.

A small incision was made in the right upper chest over the anterior
right second rib. Utilizing blunt dissection, a subcutaneous pocket
was created in the caudal direction. The pocket was irrigated with a
copious amount of sterile normal saline. The port catheter was
tunneled from the chest incision, and out the neck incision. The
reservoir was inserted into the subcutaneous pocket and secured with
two 3-0 Ethilon stitches. A peel-away sheath was advanced over the
Amplatz wire. The port catheter was cut to measure length and
inserted through the peel-away sheath. The peel-away sheath was
removed. The chest incision was closed with 3-0 Vicryl interrupted
stitches for the subcutaneous tissue and a running of 4-0 Vicryl
subcuticular stitch for the skin. The neck incision was closed with
a 4-0 Vicryl subcuticular stitch. Derma-bond was applied to both
surgical incisions. The port reservoir was flushed and instilled
with heparinized saline. No complications.
FINDINGS: A right IJ vein Port-A-Cath is in place with its tip at the
cavoatrial junction.

COMPLICATIONS:
None
IMPRESSION: Successful 8 French right internal jugular vein power port placement
with its tip at the SVC/RA junction.

## 2016-04-28 IMAGING — MR MR PELVIS W/O CM
3 series · 38 of 48 positions shown · non-contrast
Comparison: None.

CLINICAL DATA: Rectal adenocarcinoma. Recently completed
chemotherapy and radiation therapy.

EXAM:
MRI PELVIS WITHOUT CONTRAST
TECHNIQUE: Multiplanar multisequence MR imaging of the pelvis was performed. No
intravenous contrast was administered.

[Series 3: t2_ax fs entire · axial · 3.0mm · 0.94mm/px · z∈[-92,+96]mm · 15 of 58 slices shown]
[im 1/58]
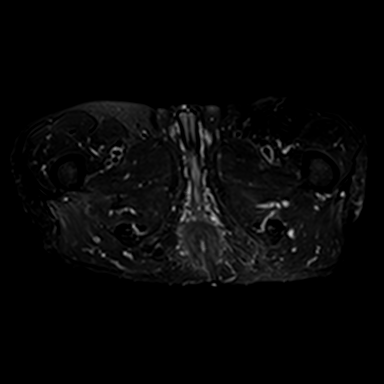
[im 5/58]
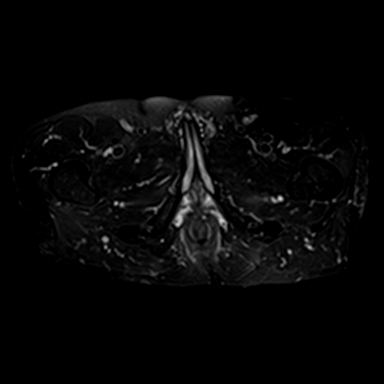
[im 9/58]
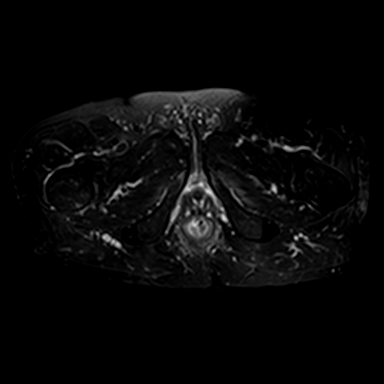
[im 13/58]
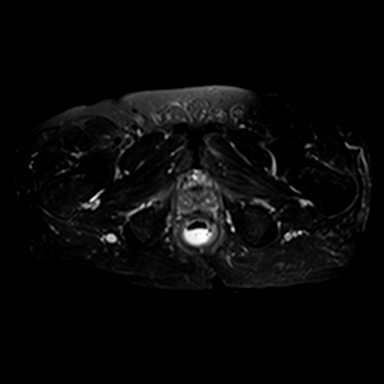
[im 17/58]
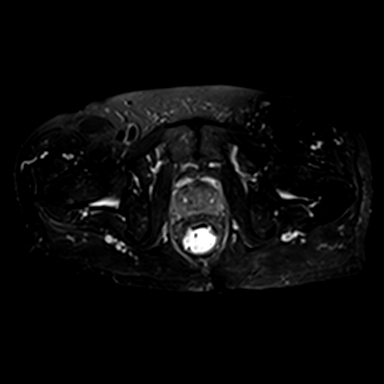
[im 21/58]
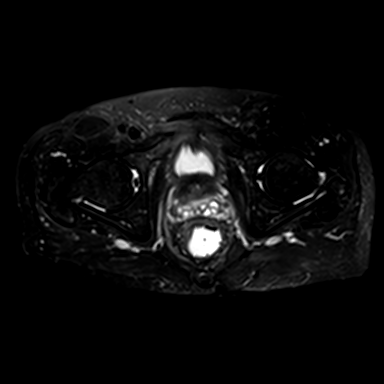
[im 25/58]
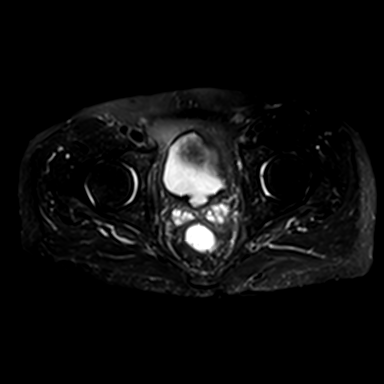
[im 29/58]
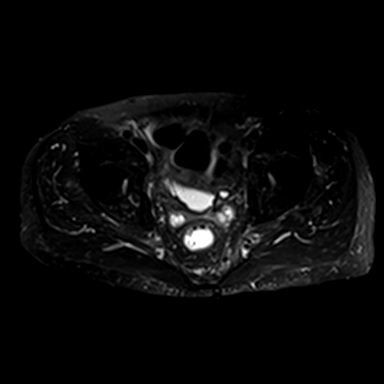
[im 33/58]
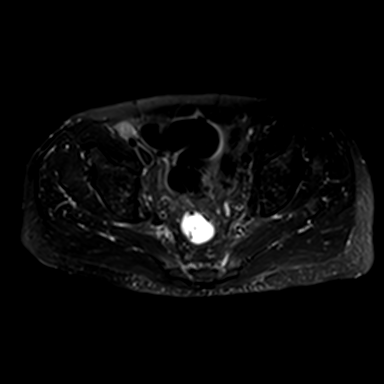
[im 37/58]
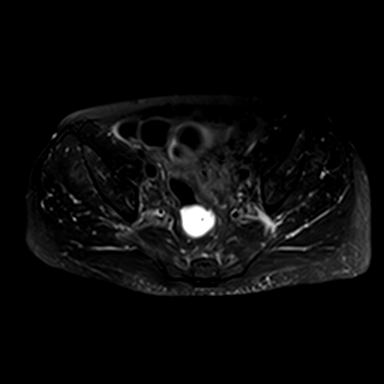
[im 41/58]
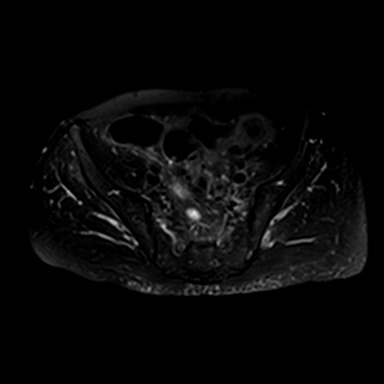
[im 45/58]
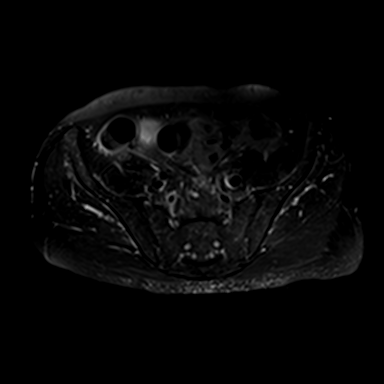
[im 49/58]
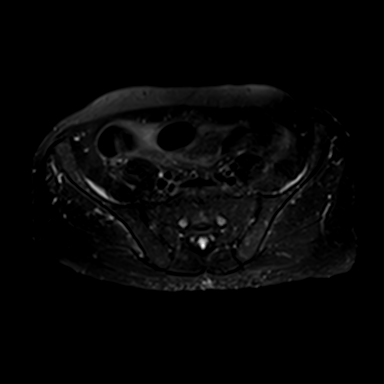
[im 53/58]
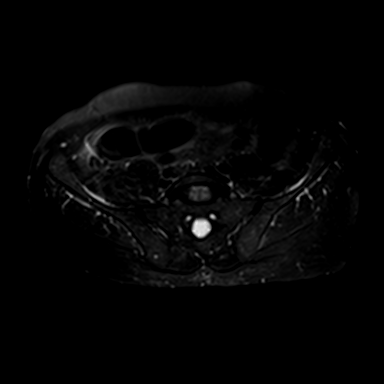
[im 58/58]
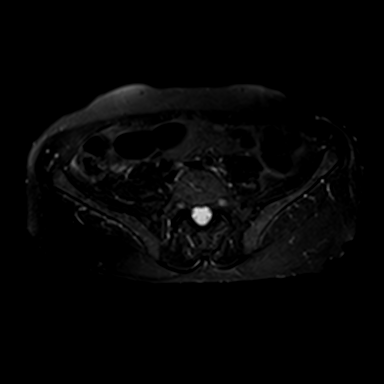

[Series 4: T1 fat-sat · axial · 3.0mm · 0.94mm/px · z∈[-92,+96]mm · 13 of 58 slices shown]
[im 1/58]
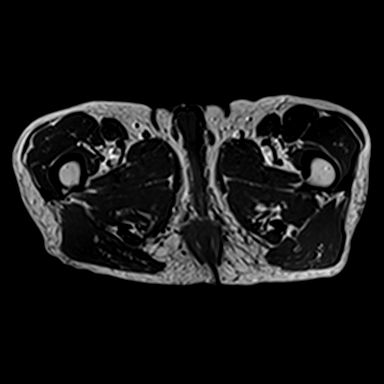
[im 5/58]
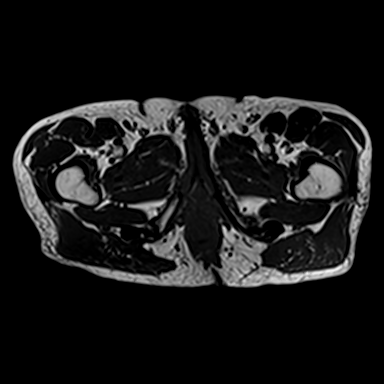
[im 9/58]
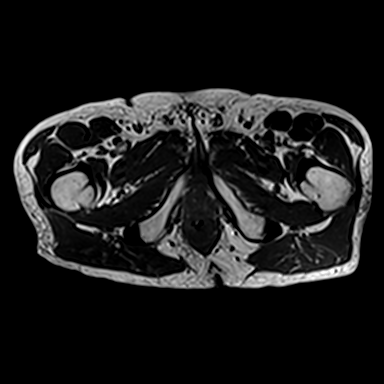
[im 13/58]
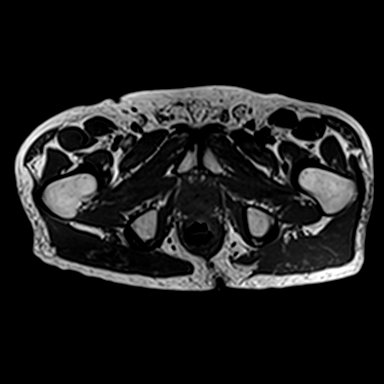
[im 17/58]
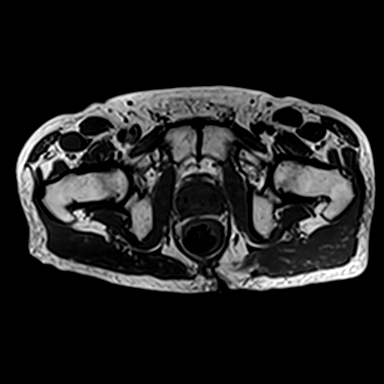
[im 21/58]
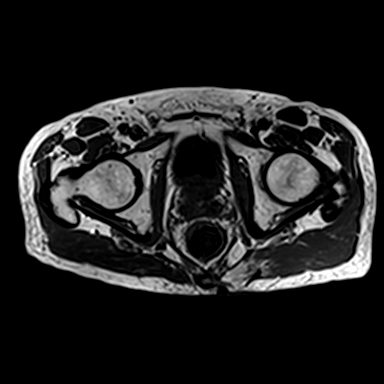
[im 25/58]
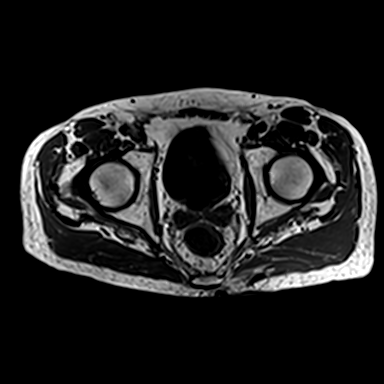
[im 29/58]
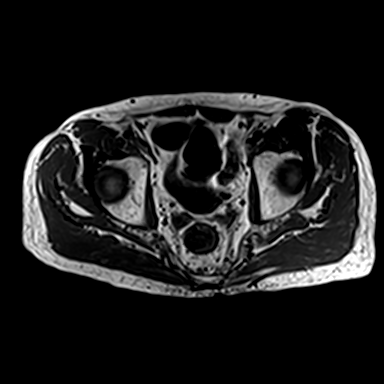
[im 33/58]
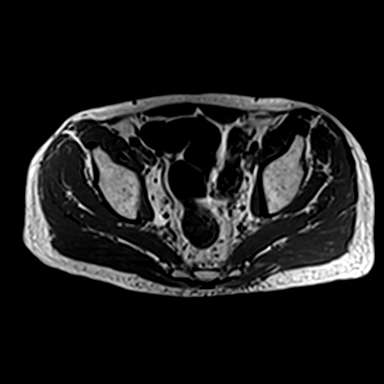
[im 37/58]
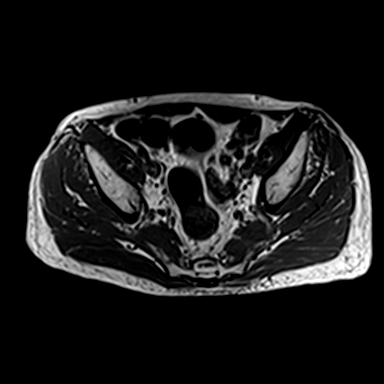
[im 41/58]
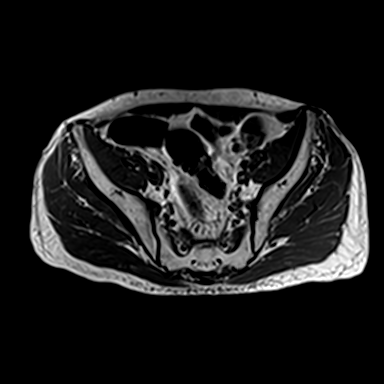
[im 49/58]
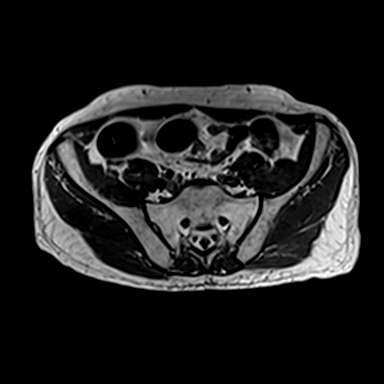
[im 58/58]
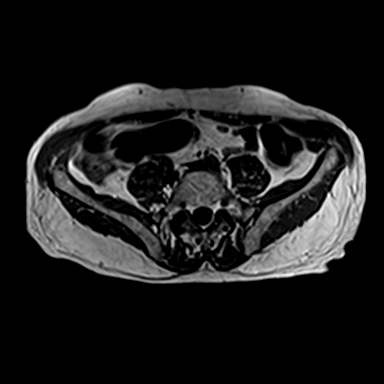

[Series 5: T1 dynamic · axial · 3.0mm · 0.80mm/px · z∈[-91,+94]mm · 10 of 72 slices shown]
[im 5/72]
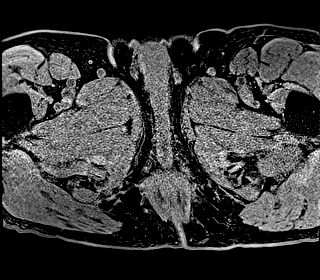
[im 13/72]
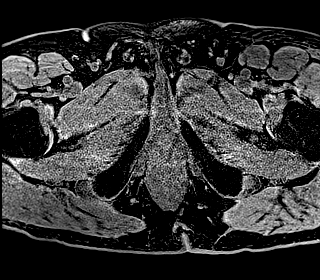
[im 21/72]
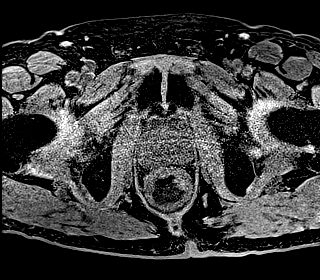
[im 30/72]
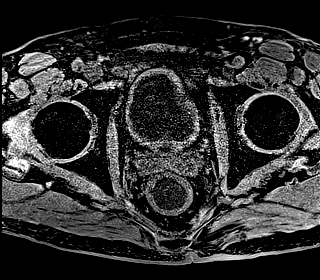
[im 38/72]
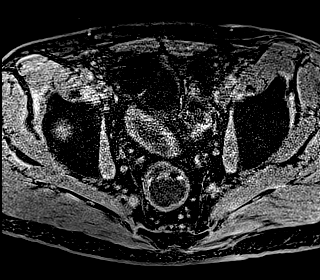
[im 42/72]
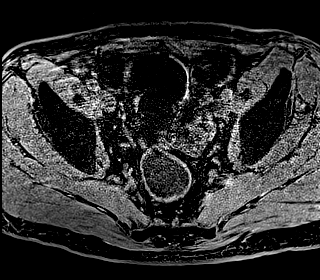
[im 51/72]
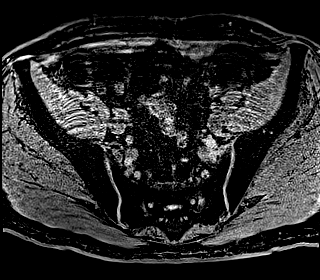
[im 59/72]
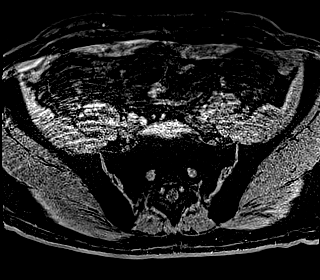
[im 63/72]
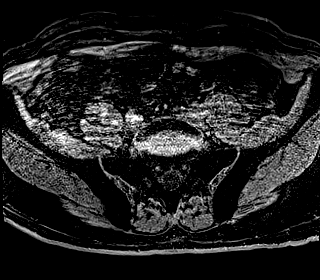
[im 67/72]
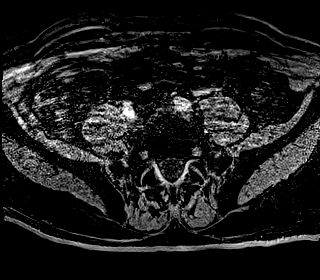

[38 of 48 positions shown; findings below may reference images not displayed]

FINDINGS: Location of Tumor in Rectum:  Mid to lower

Tumor Description: Mild irregular plaque-like wall thickening of the
anterior rectal wall

Length of Tumor: 6 cm

Distance from inferior tumor margin to internal sphincter/anorectal
junction: 1 cm

Tumor Extension through Muscularis Propria: Probable tumor extension
through muscularis propria to left of midline seen on image
19/series 3.

Adjacent Organ Involvement: The anterior rectal wall thickening
abuts the posterior margin of the prostate gland, with no visible
fat plane, although there is no definite evidence of direct invasion
into the prostate gland.

Levator Ani Involvement:  No

Perirectal Lymph Nodes >=5mm: None

Other: Nondilated bladder with mild diffuse wall thickening and tiny
left-sided bladder diverticulum. This may likely due to radiation
therapy. Sigmoid diverticulosis is also demonstrated, without
evidence of diverticulitis.
IMPRESSION: Mild irregular thickening of the anterior wall in the mid to lower
rectum, consistent with known rectal carcinoma. Probable extension
through muscularis propria noted. This also abuts the posterior
prostate, although there is no definite evidence of direct prostate
invasion.

No evidence of perirectal lymphadenopathy or other pelvic metastatic
disease.

## 2016-06-03 ENCOUNTER — Other Ambulatory Visit: Payer: Self-pay | Admitting: Nurse Practitioner

## 2016-06-07 DEATH — deceased
